# Patient Record
Sex: Female | Born: 1970 | Race: White | Hispanic: No | Smoking: Former smoker
Health system: Southern US, Community
[De-identification: ages and names within clinical notes are randomized; demographics above are authoritative.]

## PROBLEM LIST (undated history)

## (undated) DIAGNOSIS — K5792 Diverticulitis of intestine, part unspecified, without perforation or abscess without bleeding: Secondary | ICD-10-CM

## (undated) DIAGNOSIS — F419 Anxiety disorder, unspecified: Secondary | ICD-10-CM

## (undated) DIAGNOSIS — R000 Tachycardia, unspecified: Secondary | ICD-10-CM

## (undated) DIAGNOSIS — Z5189 Encounter for other specified aftercare: Secondary | ICD-10-CM

## (undated) DIAGNOSIS — F32A Depression, unspecified: Secondary | ICD-10-CM

## (undated) DIAGNOSIS — H269 Unspecified cataract: Secondary | ICD-10-CM

## (undated) DIAGNOSIS — K7460 Unspecified cirrhosis of liver: Secondary | ICD-10-CM

## (undated) DIAGNOSIS — J449 Chronic obstructive pulmonary disease, unspecified: Secondary | ICD-10-CM

## (undated) HISTORY — DX: Encounter for other specified aftercare: Z51.89

## (undated) HISTORY — PX: PATELLA FRACTURE SURGERY: SHX735

## (undated) HISTORY — PX: COLON SURGERY: SHX602

## (undated) HISTORY — PX: FRACTURE SURGERY: SHX138

## (undated) HISTORY — PX: OOPHORECTOMY: SHX86

## (undated) HISTORY — PX: INSERTION OF MESH: SHX5868

## (undated) HISTORY — DX: Unspecified cirrhosis of liver: K74.60

## (undated) HISTORY — DX: Unspecified cataract: H26.9

## (undated) HISTORY — DX: Depression, unspecified: F32.A

## (undated) HISTORY — PX: EYE SURGERY: SHX253

## (undated) HISTORY — PX: HERNIA REPAIR: SHX51

## (undated) HISTORY — PX: ABDOMINAL HERNIA REPAIR: SHX539

## (undated) HISTORY — DX: Anxiety disorder, unspecified: F41.9

---

## 1998-04-16 ENCOUNTER — Other Ambulatory Visit: Admission: RE | Admit: 1998-04-16 | Discharge: 1998-04-16 | Payer: Self-pay | Admitting: Obstetrics & Gynecology

## 2007-07-11 HISTORY — PX: ABDOMINAL HERNIA REPAIR: SHX539

## 2008-12-30 ENCOUNTER — Emergency Department (HOSPITAL_COMMUNITY): Admission: EM | Admit: 2008-12-30 | Discharge: 2008-12-31 | Payer: Self-pay | Admitting: Emergency Medicine

## 2009-07-10 HISTORY — PX: PATELLA FRACTURE SURGERY: SHX735

## 2009-07-12 ENCOUNTER — Emergency Department (HOSPITAL_COMMUNITY): Admission: EM | Admit: 2009-07-12 | Discharge: 2009-07-12 | Payer: Self-pay | Admitting: Family Medicine

## 2009-08-25 ENCOUNTER — Emergency Department (HOSPITAL_COMMUNITY): Admission: EM | Admit: 2009-08-25 | Discharge: 2009-08-26 | Payer: Self-pay | Admitting: Emergency Medicine

## 2010-02-22 ENCOUNTER — Inpatient Hospital Stay (HOSPITAL_COMMUNITY): Admission: EM | Admit: 2010-02-22 | Discharge: 2010-02-24 | Payer: Self-pay | Admitting: Emergency Medicine

## 2010-05-31 ENCOUNTER — Encounter: Admission: RE | Admit: 2010-05-31 | Discharge: 2010-05-31 | Payer: Self-pay | Admitting: Sports Medicine

## 2010-06-16 ENCOUNTER — Encounter
Admission: RE | Admit: 2010-06-16 | Discharge: 2010-07-05 | Payer: Self-pay | Source: Home / Self Care | Attending: Orthopedic Surgery | Admitting: Orthopedic Surgery

## 2010-07-10 HISTORY — PX: OOPHORECTOMY: SHX86

## 2010-07-10 HISTORY — PX: ANKLE FRACTURE SURGERY: SHX122

## 2010-09-23 LAB — BASIC METABOLIC PANEL
BUN: 4 mg/dL — ABNORMAL LOW (ref 6–23)
Chloride: 105 mEq/L (ref 96–112)
Chloride: 105 mEq/L (ref 96–112)
GFR calc Af Amer: >60 mL/min (ref >60)
Glucose, Bld: 118 mg/dL — ABNORMAL HIGH (ref 70–99)
Potassium: 4 mEq/L (ref 3.5–5.1)
Potassium: 4.5 mEq/L (ref 3.5–5.1)
Sodium: 137 mEq/L (ref 135–145)
Sodium: 138 mEq/L (ref 135–145)

## 2010-09-23 LAB — CBC
HCT: 34.4 % — ABNORMAL LOW (ref 36.0–46.0)
HCT: 35.7 % — ABNORMAL LOW (ref 36.0–46.0)
Hemoglobin: 11.5 g/dL — ABNORMAL LOW (ref 12.0–15.0)
MCH: 31.3 pg (ref 26.0–34.0)
MCHC: 33.4 g/dL (ref 30.0–36.0)
MCV: 93.7 fL (ref 78.0–100.0)
MCV: 93.9 fL (ref 78.0–100.0)
Platelets: 194 10*3/uL (ref 150–400)
Platelets: 204 10*3/uL (ref 150–400)
RBC: 3.67 MIL/uL — ABNORMAL LOW (ref 3.87–5.11)
RBC: 3.8 MIL/uL — ABNORMAL LOW (ref 3.87–5.11)
RDW: 13.7 % (ref 11.5–15.5)
WBC: 10.1 10*3/uL (ref 4.0–10.5)
WBC: 7.2 10*3/uL (ref 4.0–10.5)

## 2010-09-25 LAB — URINE CULTURE

## 2010-09-25 LAB — POCT PREGNANCY, URINE: Preg Test, Ur: NEGATIVE

## 2010-09-25 LAB — POCT URINALYSIS DIP (DEVICE)
Nitrite: NEGATIVE
Protein, ur: 30 mg/dL — AB
pH: 5.5 (ref 5.0–8.0)

## 2010-09-28 LAB — POCT PREGNANCY, URINE: Preg Test, Ur: NEGATIVE

## 2010-09-28 LAB — URINALYSIS, ROUTINE W REFLEX MICROSCOPIC
Glucose, UA: NEGATIVE mg/dL
Hgb urine dipstick: NEGATIVE
Specific Gravity, Urine: 1.007 (ref 1.005–1.030)
Urobilinogen, UA: 0.2 mg/dL (ref 0.0–1.0)

## 2010-09-28 LAB — DIFFERENTIAL
Lymphocytes Relative: 24 % (ref 12–46)
Lymphs Abs: 2.3 10*3/uL (ref 0.7–4.0)
Neutro Abs: 6.3 10*3/uL (ref 1.7–7.7)
Neutrophils Relative %: 67 % (ref 43–77)

## 2010-09-28 LAB — POCT I-STAT, CHEM 8
BUN: 11 mg/dL (ref 6–23)
Creatinine, Ser: 0.6 mg/dL (ref 0.4–1.2)
Hemoglobin: 13.9 g/dL (ref 12.0–15.0)
Potassium: 4 mEq/L (ref 3.5–5.1)
Sodium: 137 mEq/L (ref 135–145)

## 2010-09-28 LAB — CBC
Platelets: 248 10*3/uL (ref 150–400)
WBC: 9.5 10*3/uL (ref 4.0–10.5)

## 2010-09-28 LAB — WET PREP, GENITAL

## 2010-09-28 LAB — GC/CHLAMYDIA PROBE AMP, GENITAL: Chlamydia, DNA Probe: NEGATIVE

## 2010-10-14 ENCOUNTER — Emergency Department (HOSPITAL_COMMUNITY)
Admission: EM | Admit: 2010-10-14 | Discharge: 2010-10-14 | Disposition: A | Discharge status: Home / Self Care | Payer: Medicaid Other | Attending: Emergency Medicine | Admitting: Emergency Medicine

## 2010-10-14 ENCOUNTER — Emergency Department (HOSPITAL_COMMUNITY): Payer: Medicaid Other

## 2010-10-14 DIAGNOSIS — M5137 Other intervertebral disc degeneration, lumbosacral region: Secondary | ICD-10-CM | POA: Insufficient documentation

## 2010-10-14 DIAGNOSIS — F411 Generalized anxiety disorder: Secondary | ICD-10-CM | POA: Insufficient documentation

## 2010-10-14 DIAGNOSIS — S0990XA Unspecified injury of head, initial encounter: Secondary | ICD-10-CM | POA: Insufficient documentation

## 2010-10-14 DIAGNOSIS — M51379 Other intervertebral disc degeneration, lumbosacral region without mention of lumbar back pain or lower extremity pain: Secondary | ICD-10-CM | POA: Insufficient documentation

## 2010-10-14 DIAGNOSIS — S335XXA Sprain of ligaments of lumbar spine, initial encounter: Secondary | ICD-10-CM | POA: Insufficient documentation

## 2010-10-14 DIAGNOSIS — Y9229 Other specified public building as the place of occurrence of the external cause: Secondary | ICD-10-CM | POA: Insufficient documentation

## 2010-10-14 DIAGNOSIS — F988 Other specified behavioral and emotional disorders with onset usually occurring in childhood and adolescence: Secondary | ICD-10-CM | POA: Insufficient documentation

## 2010-10-14 LAB — POCT PREGNANCY, URINE: Preg Test, Ur: NEGATIVE

## 2011-10-05 IMAGING — CR DG ANKLE COMPLETE 3+V*R*
3 series · 3 of 3 positions shown · non-contrast
Comparison: None.

CLINICAL DATA: Status post fall down stairs, with diffuse right
ankle pain.

RIGHT ANKLE - COMPLETE 3+ VIEW

[view not recorded (1 of 3)]
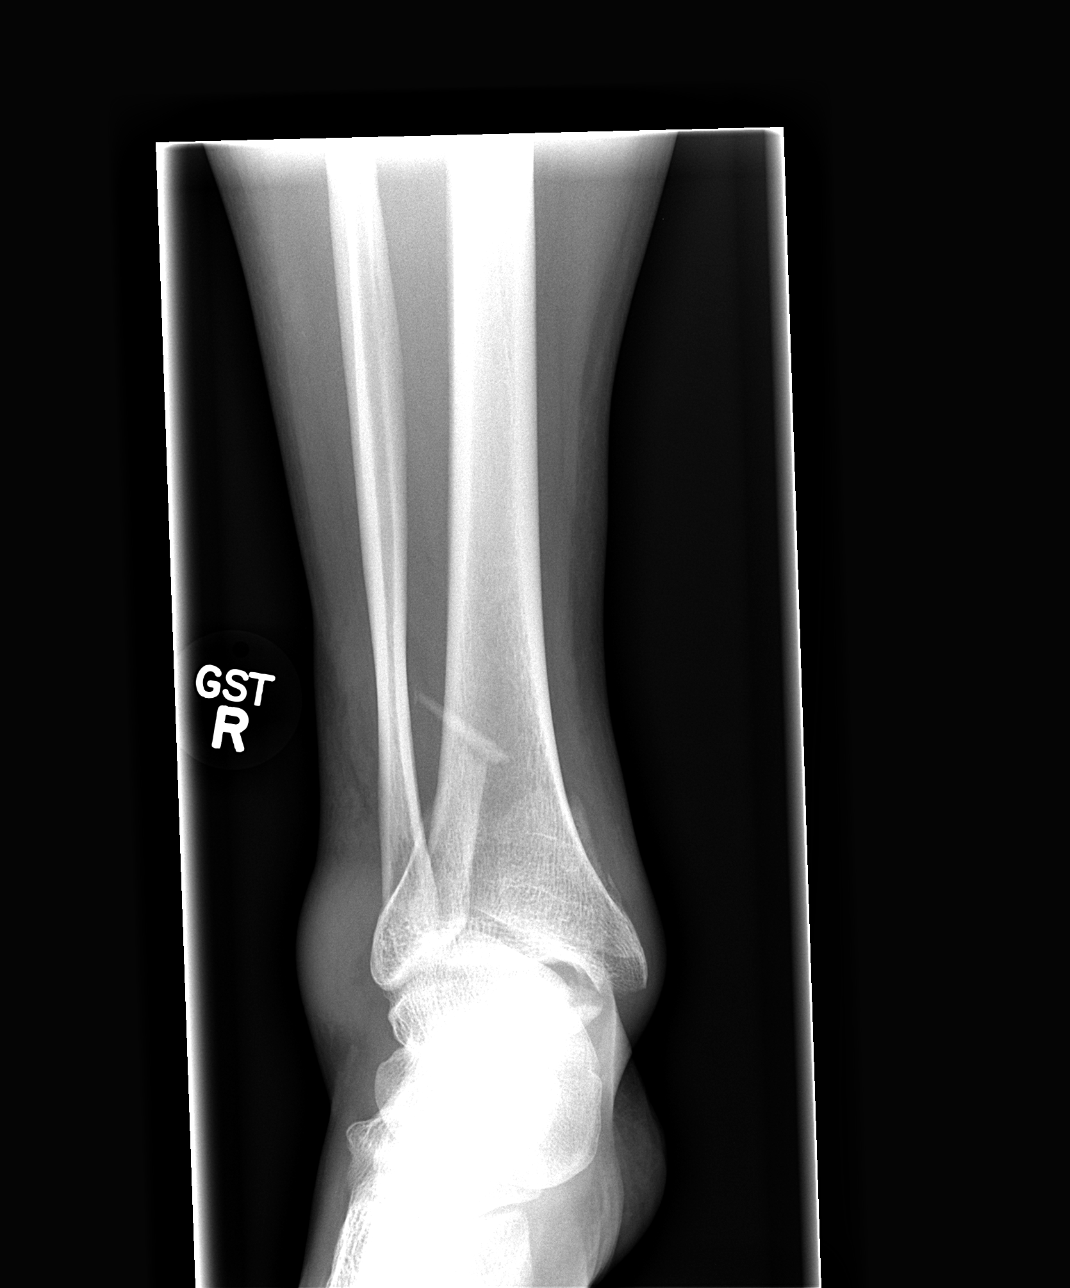

[view not recorded (2 of 3)]
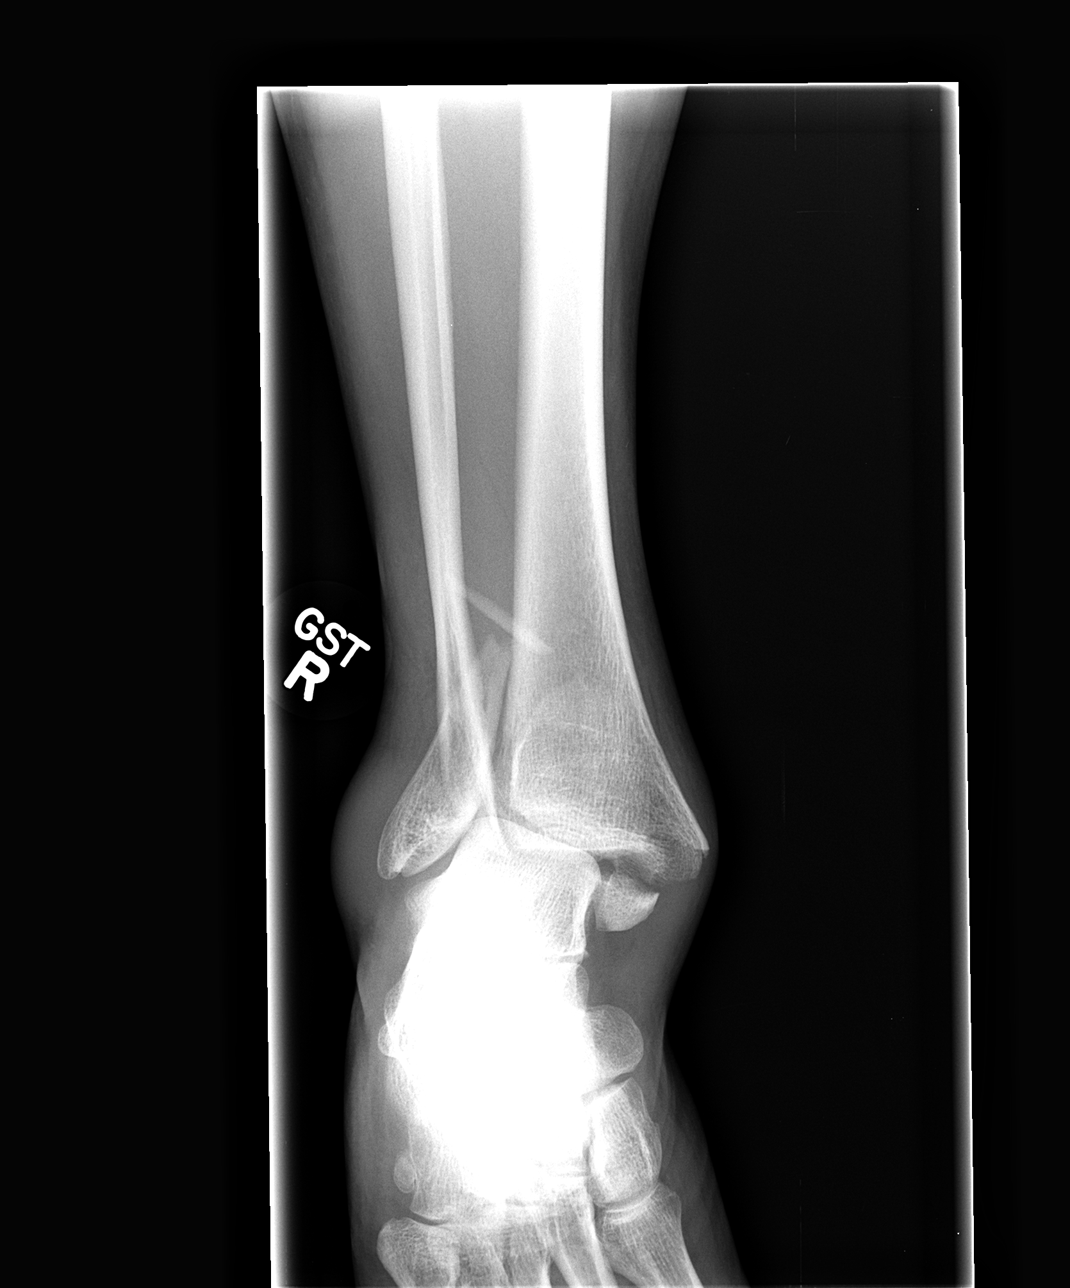

[view not recorded (3 of 3)]
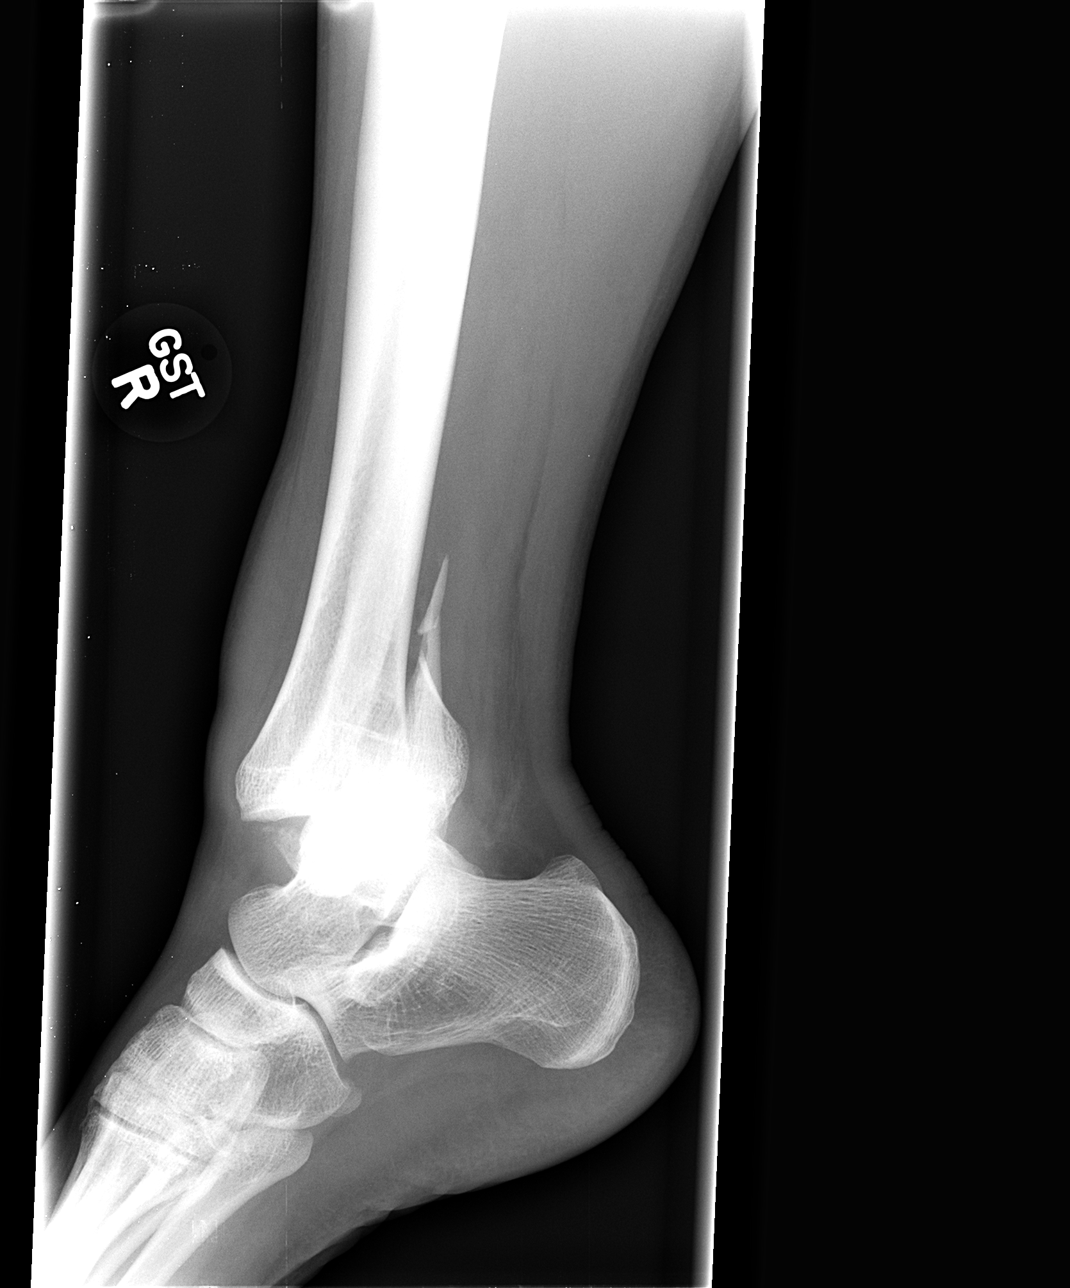

[3 of 3 positions shown; findings below may reference images not displayed]

FINDINGS: There is a significantly laterally displaced horizontal
fracture through the medial malleolus, a posteriorly displaced
fracture involving the posterior malleolus, and a comminuted
posteriorly displaced and laterally angulated fracture involving
the distal fibular diaphysis.  There is associated disruption of
the ankle mortise, with posterior talar subluxation and lateral
talar angulation.

An os peroneum is noted.  The remaining joint spaces are preserved.
Anterior and lateral soft tissue swelling is noted.
IMPRESSION: 1.  Significantly displaced horizontal fracture through the medial
malleolus, posterior displaced fracture involving the posterior
malleolus, and posteriorly displaced laterally angulated comminuted
fracture involving the distal fibular diaphysis.
2.  Associated posterior talar subluxation and lateral talar
angulation.
3.  Os peroneum noted.

## 2014-07-21 ENCOUNTER — Other Ambulatory Visit: Payer: Self-pay | Admitting: Gastroenterology

## 2014-07-21 DIAGNOSIS — R109 Unspecified abdominal pain: Secondary | ICD-10-CM

## 2014-07-30 ENCOUNTER — Ambulatory Visit
Admission: RE | Admit: 2014-07-30 | Discharge: 2014-07-30 | Disposition: A | Discharge status: Home / Self Care | Payer: Medicaid Other | Source: Ambulatory Visit | Attending: Gastroenterology | Admitting: Gastroenterology

## 2014-07-30 DIAGNOSIS — R109 Unspecified abdominal pain: Secondary | ICD-10-CM

## 2014-07-30 MED ORDER — IOHEXOL 300 MG/ML  SOLN
100.0000 mL | Freq: Once | INTRAMUSCULAR | Status: AC | PRN
  Administered 2014-07-30: 100 mL via INTRAVENOUS

## 2016-03-11 IMAGING — CT CT ABD-PELV W/ CM
3 of 5 series · 13 of 36 positions shown, 19 images · IV contrast (READICAT/WATER & [ID] OMNI 300)
Comparison: None.

CLINICAL DATA: Worsening lower abdominal pain for 2 years. Prior
cholecystectomy patient. Colostomy with reversal for ruptured
diverticulum. Incisional hernia repair.

EXAM:
CT ABDOMEN AND PELVIS WITH CONTRAST
TECHNIQUE: Multidetector CT imaging of the abdomen and pelvis was performed
using the standard protocol following bolus administration of
intravenous contrast.
CONTRAST:  100mL OMNIPAQUE IOHEXOL 300 MG/ML  SOLN

[Series 3: abd/pelvis with · axial · 0.82mm/px · z∈[-376,-46]mm · 7 of 88 slices shown, 12 images]
[im 11/88  soft-tissue]
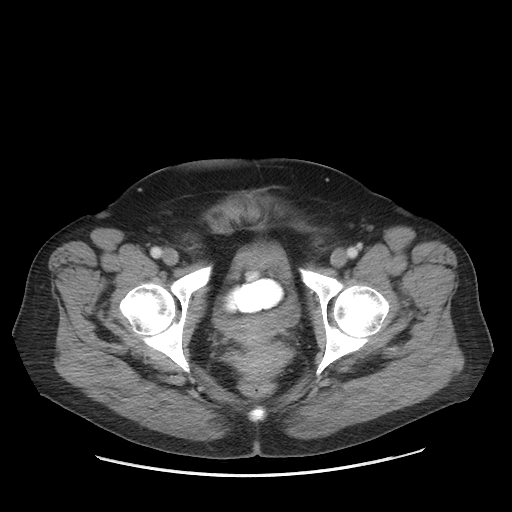
[im 11/88  bone]
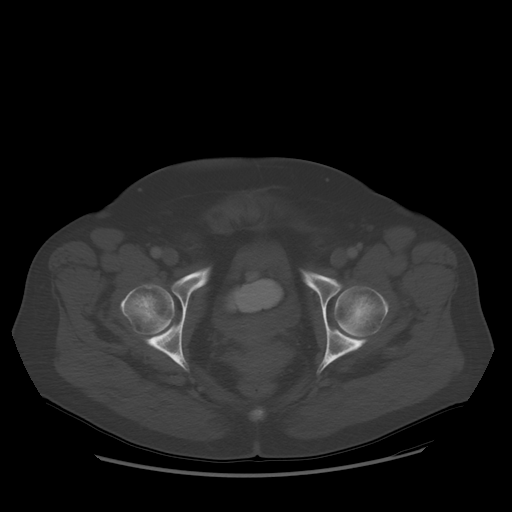
[im 22/88  soft-tissue]
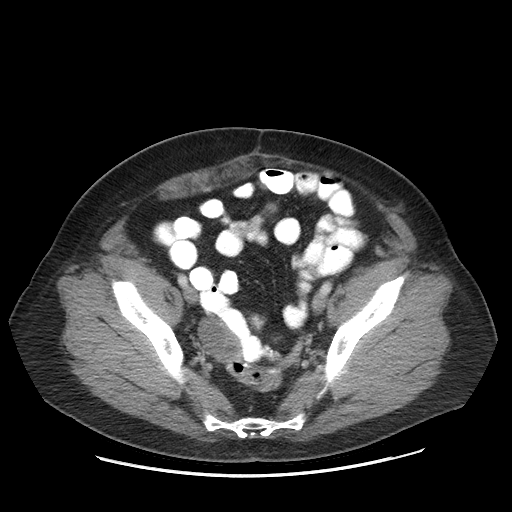
[im 33/88  soft-tissue]
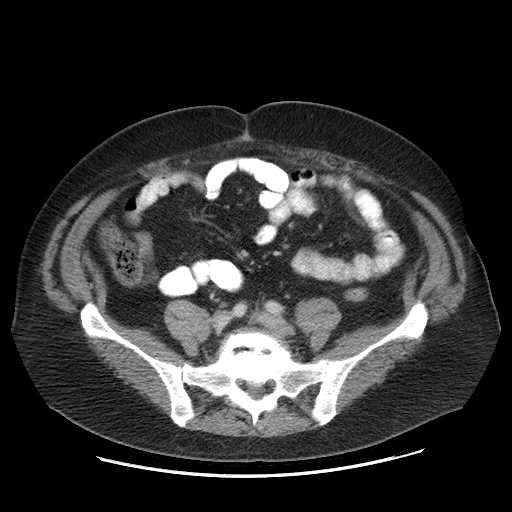
[im 44/88  soft-tissue]
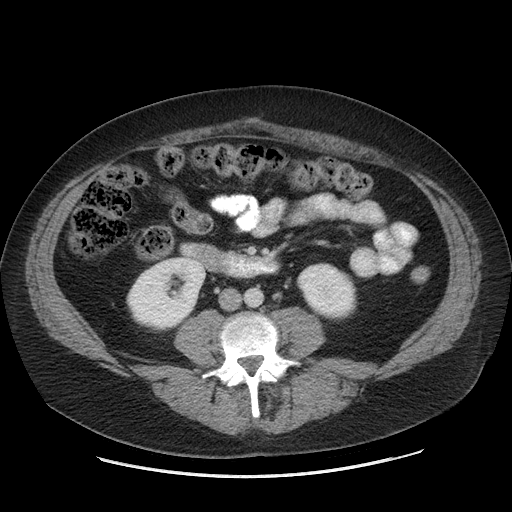
[im 44/88  lung]
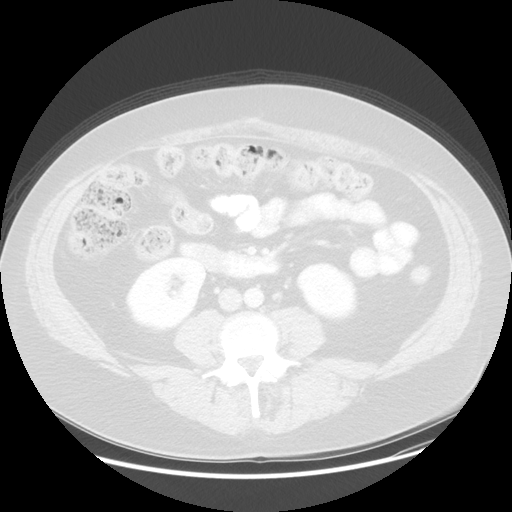
[im 55/88  soft-tissue]
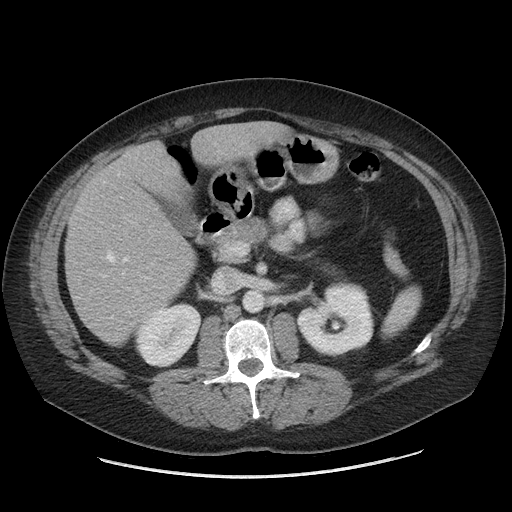
[im 55/88  lung]
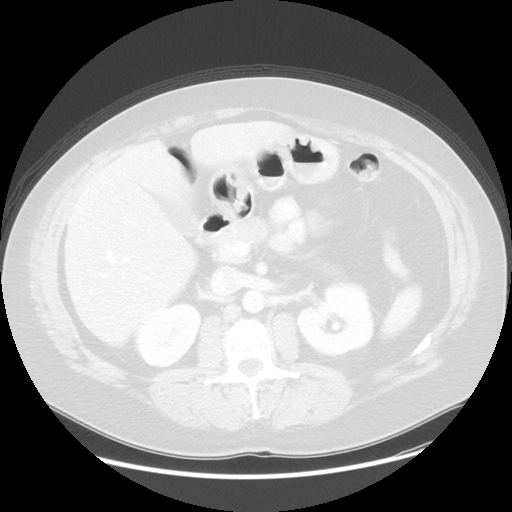
[im 66/88  soft-tissue]
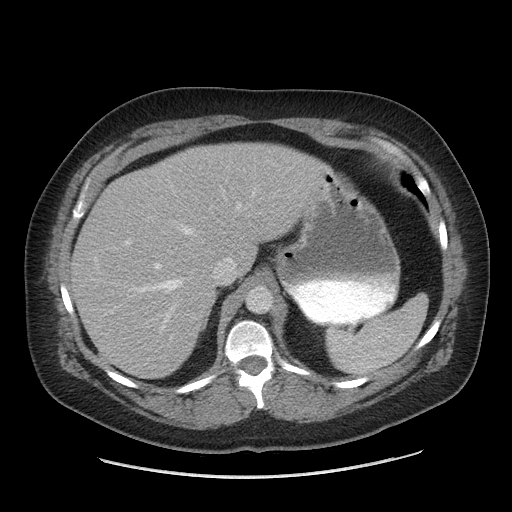
[im 66/88  lung]
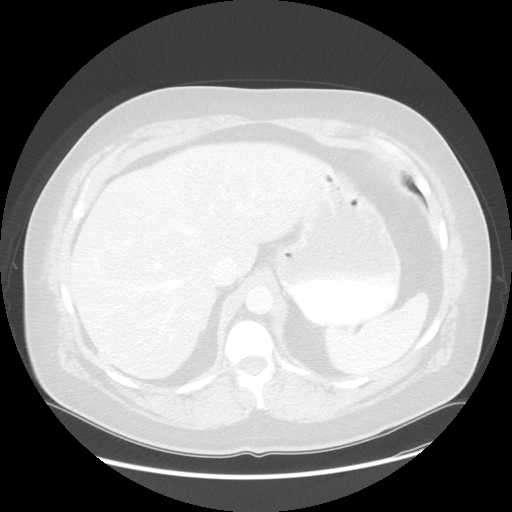
[im 77/88  soft-tissue]
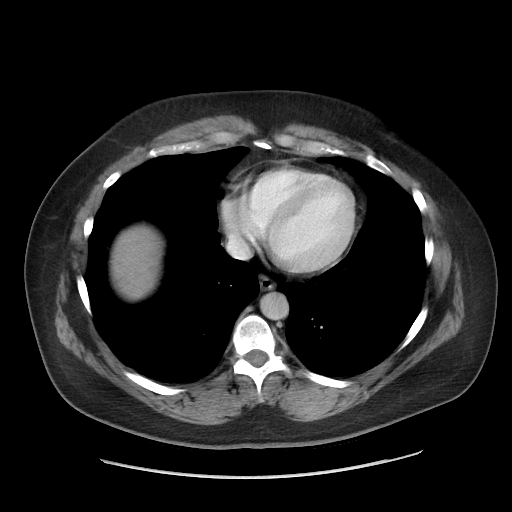
[im 77/88  lung]
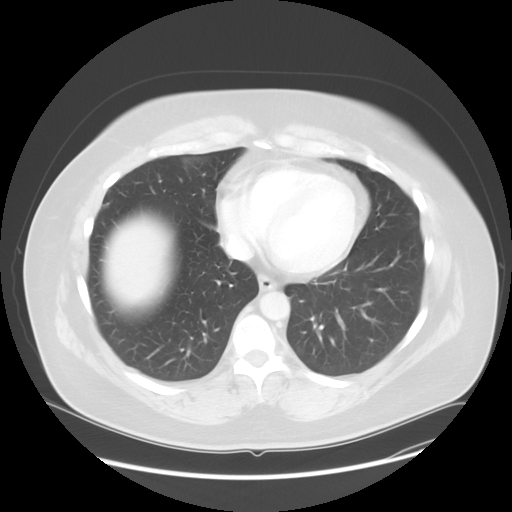

[Series 601: coronal body · coronal · 0.87mm/px · 1 of 129 slices shown, 2 images]
[im 43/129  soft-tissue]
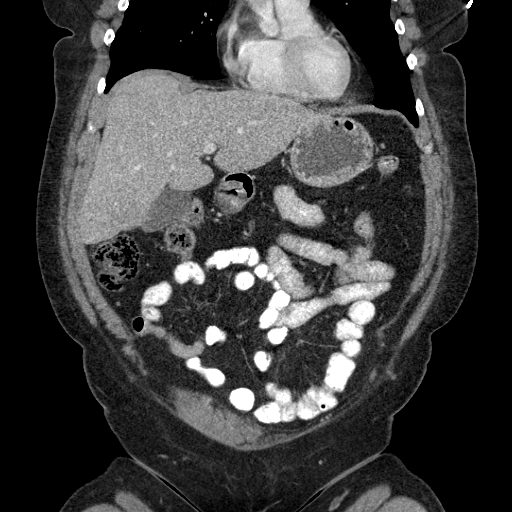
[im 43/129  bone]
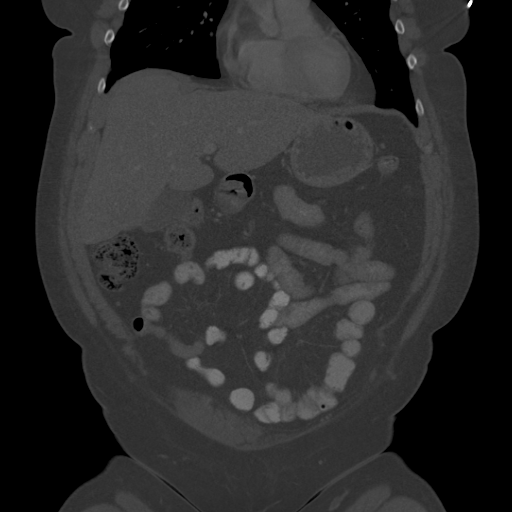

[Series 602: sagittal body · sagittal · 0.87mm/px · 5 of 169 slices shown]
[im 20/169  soft-tissue]
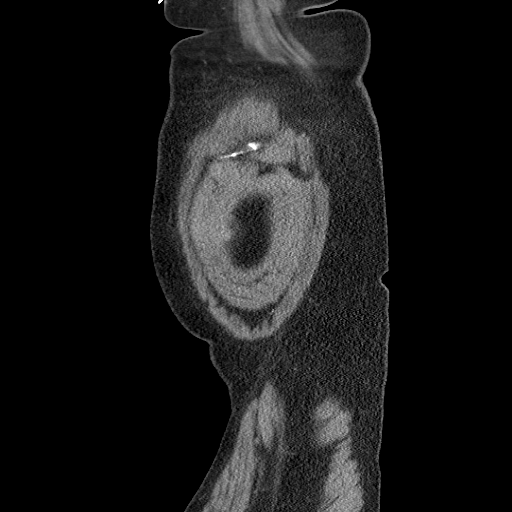
[im 40/169  soft-tissue]
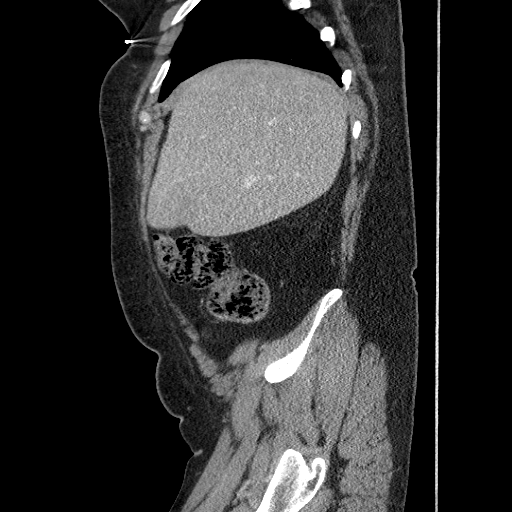
[im 60/169  soft-tissue]
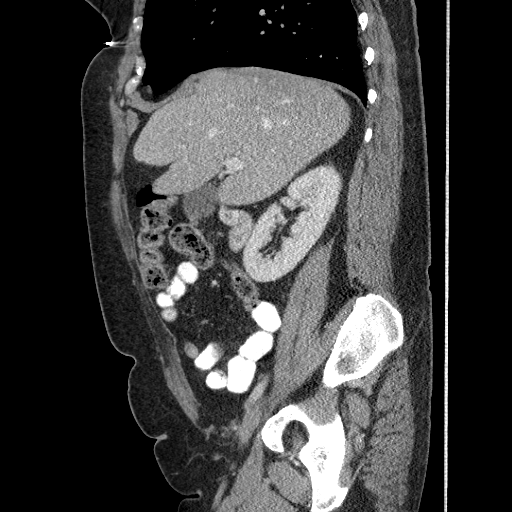
[im 80/169  soft-tissue]
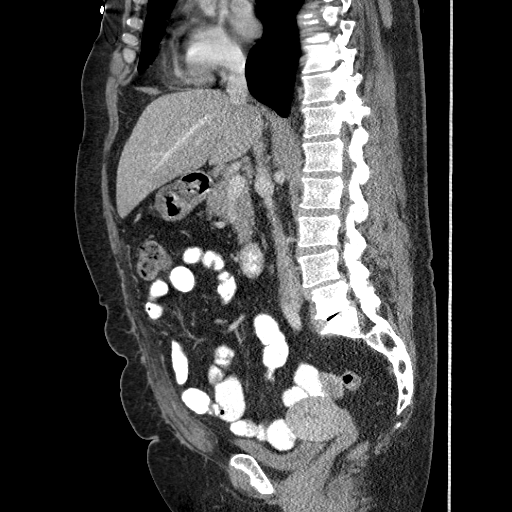
[im 99/169  soft-tissue]
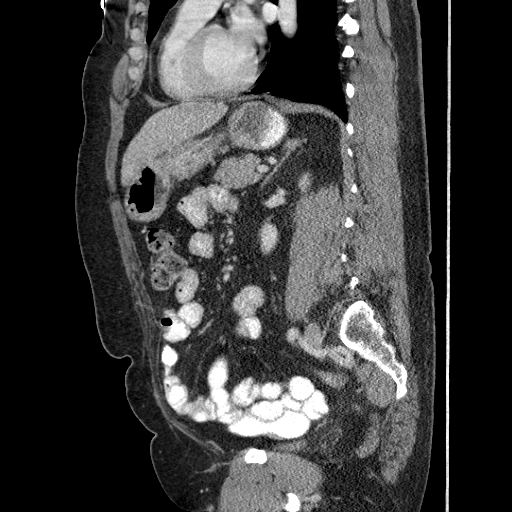

[13 of 36 positions shown; findings below may reference images not displayed]

FINDINGS: Lower chest:  Lung bases are clear.

Hepatobiliary: Low-density cyst in the right hepatic lobe measures
less 10 mm. No biliary duct dilatation. Normal gallbladder.

Pancreas: Pancreas is normal. No ductal dilatation. No pancreatic
inflammation.

Spleen: Normal spleen.

Adrenals/urinary tract: Adrenal glands are normal kidneys, ureters,
and bladder are normal.

Stomach/Bowel: Stomach, small bowel, appendix, cecum are normal. The
left colon is collapsed. No obstructing lesion is identified. There
is a bowel anastomosis in the sigmoid colon without obstruction or
nodularity. Rectum appears normal.

Vascular/Lymphatic: Abdominal aorta is normal caliber. There is no
retroperitoneal or periportal lymphadenopathy. No pelvic
lymphadenopathy.

Reproductive: Right ovaryhas low-density cystic change measuring
by 2.9 cm. Left ovary is absent. No free fluid the pelvis. No pelvic
lymphadenopathy

Musculoskeletal: No aggressive osseous lesion.

Other: Ventral hernia repair without complicating features appear.
IMPRESSION: 1. No acute findings in the abdomen pelvis. No bowel obstruction or
inflammation.
2. Ventral hernia repair without complication.
3. Mild cysts enlargement of right ovary is felt to within normal
limits. This recommendation follows ACR consensus guidelines: White
Paper of the ACR Incidental Findings Committee II on Adnexal
Findings. [HOSPITAL] [DATE].

## 2022-04-12 ENCOUNTER — Ambulatory Visit (HOSPITAL_COMMUNITY)
Admission: EM | Admit: 2022-04-12 | Discharge: 2022-04-12 | Disposition: A | Discharge status: Home / Self Care | Payer: No Payment, Other | Attending: Psychiatry | Admitting: Psychiatry

## 2022-04-12 DIAGNOSIS — F419 Anxiety disorder, unspecified: Secondary | ICD-10-CM | POA: Insufficient documentation

## 2022-04-12 DIAGNOSIS — F1010 Alcohol abuse, uncomplicated: Secondary | ICD-10-CM | POA: Insufficient documentation

## 2022-04-12 DIAGNOSIS — F339 Major depressive disorder, recurrent, unspecified: Secondary | ICD-10-CM | POA: Insufficient documentation

## 2022-04-12 NOTE — Progress Notes (Signed)
Pt says that she came to [place] she is "so anxious I don't what to do."  She has depression.  Pt says that she feels like she is not able to deal with the anxiety and depression.  She lives with her 51 year old mother and cares for her.  She is not working now due to her anxiety. Pt does not have any SI or no attempts.  No HI or A/V hallucinations.  Pt admits to drinking ETOH "as much as I can and being able to get away with it."  Pt may drink < a bottle of wine or a 12 pack of hard seltzers a day.  Pt drank about a 6 pack of hard seltzers in the last 24 hours.  Patient uses CBD (edibles and vape cartridges) daily.  Pt has had medical issues in the past   She is not on any medication currently.  Pt has no current outpatient therapy. Pt is routine.

## 2022-04-12 NOTE — Discharge Instructions (Addendum)
F/u with walk-in psychiatry  

## 2022-04-12 NOTE — ED Notes (Signed)
AVS reviewed and 2nd floor out pt services information provided.

## 2022-04-12 NOTE — ED Provider Notes (Signed)
Behavioral Health Urgent Care Medical Screening Exam  Patient Name: Sydney Mccoy MRN: [id number] Date of Evaluation: 04/12/22 Chief Complaint:   Diagnosis:  Final diagnoses:  Recurrent major depressive disorder, remission status unspecified ([place])  Alcohol abuse  Anxious mood    History of Present illness: Sydney Mccoy is a 51 y.o. female. With a history of major depressive disorder and general anxiety disorder presented to [facility] voluntarily.  According to patient she is having extreme anxiety all the time and this has been going on since she was age 23 years old.  According to patient she was diagnosed with depression and anxiety a couple years ago before her now 67 year old son was 88 years old and she was prescribed medication and antidepressants however she felt better if she stopped taking them because her insurance ran out around 7 years ago.  Patient stated that [place] works good for her. At according to patient she is not currently seeing a psychiatrist or therapist.  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  Patient is unemployed outside of the home.  According to patient she drinks alcohol daily, and she is very daily.  Face-to-face observation of patient, patient is alert and oriented x 4, speech is clear, maintaining good eye contact.  Mood is depressed and anxious affect congruent with mood.  Patient denies suicidal ideation, HI, AVH or paranoia.  Per the patient she can keep herself safe and she does not feel that she is not a danger to herself.  According to patient she just need to get some help for her depression and anxiety.  According to patient she smokes CBD daily.  Patient report she drinks alcohol on a daily basis.  She denies any other illicit drug use.  NP discussed with patient the walk-in clinic that is available and the patient was given information to follow up with walk-in clinic in the a.m.  Psychiatric Specialty  Exam  Presentation  General Appearance:Casual  Eye Contact:Fair  Speech:Clear and Coherent  Speech Volume:Normal  Handedness:Right   Mood and Affect  Mood: Anxious; Depressed  Affect: Congruent   Thought Process  Thought Processes: Coherent  Descriptions of Associations:Circumstantial  Orientation:Full (Time, Place and Person)  Thought Content:Logical    Hallucinations:None  Ideas of Reference:None  Suicidal Thoughts:No  Homicidal Thoughts:No   Sensorium  Memory:No data recorded Judgment: Fair  Insight: Good   Executive Functions  Concentration: Good  Attention Span: Good  Recall: Good  Fund of Knowledge: Good  Language: Good   Psychomotor Activity  Psychomotor Activity: Normal   Assets  Assets: Desire for Improvement   Sleep  Sleep: Fair  Number of hours:  5   No data recorded  Physical Exam: Physical Exam HENT:     Head: Normocephalic.     Nose: Nose normal.  Cardiovascular:     Rate and Rhythm: Normal rate.  Pulmonary:     Effort: Pulmonary effort is normal.  Musculoskeletal:        General: Normal range of motion.     Cervical back: Normal range of motion.  Skin:    General: Skin is warm.  Neurological:     General: No focal deficit present.     Mental Status: She is alert.  Psychiatric:        Behavior: Behavior normal.        Thought Content: Thought content normal.    Review of Systems  Constitutional: Negative.   HENT: Negative.  Eyes: Negative.   Respiratory: Negative.    Cardiovascular: Negative.   Gastrointestinal: Negative.   Genitourinary: Negative.   Musculoskeletal: Negative.   Skin: Negative.   Neurological: Negative.   Endo/Heme/Allergies: Negative.   Psychiatric/Behavioral:  Positive for depression and substance abuse. The patient is nervous/anxious.    Blood pressure (!) 161/101, pulse 93, temperature 98.6 F (37 C), temperature source Oral, resp. rate 18, SpO2 95 %. There  is no height or weight on file to calculate BMI.  Musculoskeletal: Strength & Muscle Tone: within normal limits Gait & Station: normal Patient leans: N/A   [place] [facility] Discharge Disposition for Follow up and Recommendations: Based on my evaluation the patient does not appear to have an emergency medical condition and can be discharged with resources and follow up care in outpatient services for Medication Management and Individual Therapy   Evette Georges, NP 04/12/2022, 8:28 PM

## 2022-04-12 NOTE — Progress Notes (Signed)
[facility] entered resources into AVS  Jeannette Maddy [facility] 

## 2022-04-13 ENCOUNTER — Encounter (HOSPITAL_COMMUNITY): Payer: Self-pay

## 2022-04-13 ENCOUNTER — Other Ambulatory Visit: Payer: Self-pay

## 2022-04-13 ENCOUNTER — Ambulatory Visit (INDEPENDENT_AMBULATORY_CARE_PROVIDER_SITE_OTHER): Payer: No Payment, Other | Admitting: Student in an Organized Health Care Education/Training Program

## 2022-04-13 ENCOUNTER — Encounter (HOSPITAL_COMMUNITY): Payer: Self-pay | Admitting: Student in an Organized Health Care Education/Training Program

## 2022-04-13 ENCOUNTER — Ambulatory Visit (HOSPITAL_COMMUNITY)
Admission: EM | Admit: 2022-04-13 | Discharge: 2022-04-13 | Disposition: A | Discharge status: Home / Self Care | Payer: No Payment, Other

## 2022-04-13 VITALS — BP 166/110 | HR 100 | Ht 67.0 in | Wt 174.0 lb

## 2022-04-13 DIAGNOSIS — F332 Major depressive disorder, recurrent severe without psychotic features: Secondary | ICD-10-CM

## 2022-04-13 DIAGNOSIS — F410 Panic disorder [episodic paroxysmal anxiety] without agoraphobia: Secondary | ICD-10-CM

## 2022-04-13 DIAGNOSIS — F411 Generalized anxiety disorder: Secondary | ICD-10-CM | POA: Diagnosis not present

## 2022-04-13 MED ORDER — HYDROXYZINE HCL 25 MG PO TABS
25.0000 mg | ORAL_TABLET | Freq: Three times a day (TID) | ORAL | 1 refills | Status: DC | PRN
  Filled 2022-04-13: qty 60, 20d supply, fill #0
  Filled 2022-05-30: qty 60, 20d supply, fill #1

## 2022-04-13 MED ORDER — PROPRANOLOL HCL 10 MG PO TABS
10.0000 mg | ORAL_TABLET | Freq: Three times a day (TID) | ORAL | 1 refills | Status: DC
  Filled 2022-04-13: qty 90, 30d supply, fill #0

## 2022-04-13 MED ORDER — MIRTAZAPINE 15 MG PO TABS
15.0000 mg | ORAL_TABLET | Freq: Every day | ORAL | 1 refills | Status: DC
  Filled 2022-04-13: qty 30, 30d supply, fill #0

## 2022-04-13 NOTE — BH Assessment (Signed)
Pt reporting worsening anxiety and depression. While attempting to get outpatient services at [facility] pt reported haivng an anxiety attack. Pt was brought down stairs by [facility] staff for crisis assessment. Pt denies SI, HI, AVH and reports having 4 alcoholic drinks around 2am today. Pt is requesting medication management and was escorted back up stairs by NP to see a provider.

## 2022-04-13 NOTE — Plan of Care (Signed)
  Problem: Anxiety Disorder CCP Problem  1  Goal: LTG: Patient will score less than 5 on the Generalized Anxiety Disorder 7 Scale (GAD-7) Outcome: Initial Goal: STG: Patient will participate in at least 80% of scheduled individual psychotherapy sessions Outcome: Initial Goal: STG: Report a decrease in anxiety symptoms as evidenced by an overall reduction in anxiety score by a minimum of 25% on the Generalized Anxiety Disorder Scale Outcome: Initial

## 2022-04-13 NOTE — ED Notes (Signed)
Patient discharging at this time. Patient denies SI,HI and A/V/H with no plan/intent. Discharge instructions and follow up appointments reviewed by NP and patient was escorted back upstairs for outpatient services to discuss medication management. No s/s of current distress.

## 2022-04-13 NOTE — Progress Notes (Signed)
Psychiatric Initial Adult Assessment   Patient Identification: Sydney Mccoy MRN:  [id number] Date of Evaluation:  04/15/2022 Referral Source: [facility] Follow Up Chief Complaint:   Chief Complaint  Patient presents with   Establish Care   Anxiety   Depression   Visit Diagnosis:    ICD-10-CM   1. GAD (generalized anxiety disorder)  F41.1 mirtazapine (REMERON) 15 MG tablet    propranolol (INDERAL) 10 MG tablet    hydrOXYzine (ATARAX) 25 MG tablet    2. Panic disorder  F41.0 mirtazapine (REMERON) 15 MG tablet    propranolol (INDERAL) 10 MG tablet    hydrOXYzine (ATARAX) 25 MG tablet    3. Severe episode of recurrent major depressive disorder, without psychotic features (HCC)  F33.2 mirtazapine (REMERON) 15 MG tablet      History of Present Illness:   Sydney Mccoy is a 51 yr old female who presents from [facility] to establish care and for medication management and therapy.  PPHx is significant for Depression, Anxiety, Bulimia, ADHD, and Auditory Processing Disorder, Suicidal Gestures (teenage years), 1 Hospitalization (age 24- [facility]), and no History of Self Injurious Behavior.  She reports that she has been having issues with anxiety her whole life but that it is never been just debilitating and never felt like she has completely lost control before.  She reports she just woke up with this.  She reports that another trigger could be her approaching age 68 as that when her father died of a heart attack.  She reports that she has been using alcohol and Benadryl to sleep and she knows this cannot continue.  She reports that she cannot sleep without the Benadryl.  She reports her appetite is very poor.  She reports that she was relatively well-controlled while on medicine and regularly seeing a therapist but that this stopped about 7 years ago when she lost her insurance.  He reports past psychiatric history significant for depression, anxiety, bulimia, ADHD, and auditory processing disorder.   She reports the bulimia was when she was a teenager and has not had any issues with that since then.  She reports there were suicidal gestures when she was a teenager but no attempts.  She reports no history of self-injurious behavior.  She does report 1 prior hospitalization at [facility] when she was 51 years old.  She reports her previous medications including-Lexapro, Prozac, Wellbutrin, Zoloft, Cymbalta, Ritalin, and Klonopin.  He reports past surgical history significant for 4 abdominal surgeries post colon perforation, left knee surgery, and 3 surgeries on her right ankle.  She reports past medical history significant for hyperlipidemia and hypertension.  She reports no history of head trauma or seizures.  She reports she last worked 10 years ago at [employer].  She currently lives at her mother's house with her mother.  She reports graduating high school and had some college.  She reports drinking 4 alcoholic seltzer's a day.  She reports she quit smoking cigarettes 2 years ago.  She reports daily CBD Vape use.  She reports no other illicit substance use.  She reports no current legal issues.  She reports no access to firearms.  Discussed with her given her depression, anxiety, sleep issues, and appetite issues we could trial Remeron.  Discussed with her that since this can take several weeks to become fully effective we could start other medications that would help in the short-term.  Discussed that given her hypertension we could start propranolol as this would help with anxiety as well.  Also discussed starting as needed hydroxyzine for acute panic spikes.  Discussed potential risks and side effects of the medications and she was agreeable to trialing them.  Discussed that we could start weekly therapy as well and she was agreeable to this.  She reports that she does not have a PCP so provided her with a list of providers who see the uninsured.  She reports no SI, HI, or AVH.  She reports her appetite is  doing poor.  She reports her sleep is poor.  She reports some mild dizziness/weakness but otherwise reports no other concerns at present.  Discussed with her what to do in the event of a future crisis.  Discussed that she can return to [facility], go to [facility], go to the nearest ED, or call [phone].   She reported understanding and had no concerns.  She will return for weekly therapy starting next week and for medication management in 4 weeks.   Associated Signs/Symptoms: Depression Symptoms:  depressed mood, anhedonia, fatigue, feelings of worthlessness/guilt, hopelessness, anxiety, panic attacks, loss of energy/fatigue, disturbed sleep, decreased appetite, (Hypo) Manic Symptoms:   Reports None Anxiety Symptoms:  Excessive Worry, Panic Symptoms, Psychotic Symptoms:   Reports None PTSD Symptoms: NA  Past Psychiatric History: Depression, Anxiety, Bulimia, ADHD, and Auditory Processing Disorder, Suicidal Gestures (teenage years), 1 Hospitalization (age 61- 63), and no History of Self Injurious Behavior.  Previous Psychotropic Medications: Yes  Lexapro, Prozac, Wellbutrin, Zoloft, Cymbalta, Ritalin, and Klonopin.  Substance Abuse History in the last 12 months:  Yes.    Consequences of Substance Abuse: NA  Past Medical History: No past medical history on file.   Family Psychiatric History: Brother- Depression, Anxiety, EtOH Abuse Mother and Maternal Grandmother- Some unknown Diagnosis' No Known Suicide.  Family History: No family history on file.  Social History:   Social History   Socioeconomic History   Marital status: Divorced    Spouse name: Not on file   Number of children: Not on file   Years of education: Not on file   Highest education level: Not on file  Occupational History   Not on file  Tobacco Use   Smoking status: Former    Types: Cigarettes   Smokeless tobacco: Never  Substance and Sexual Activity   Alcohol use: Yes    Alcohol/week: 28.0 standard  drinks of alcohol    Types: 28 Standard drinks or equivalent per week   Drug use: Yes    Comment: CBD oil   Sexual activity: Not on file  Other Topics Concern   Not on file  Social History Narrative   Not on file   Social Determinants of Health   Financial Resource Strain: Not on file  Food Insecurity: Not on file  Transportation Needs: Not on file  Physical Activity: Not on file  Stress: Not on file  Social Connections: Not on file    Additional Social History: None  Allergies:  Not on File  Metabolic Disorder Labs: No results found for: "HGBA1C", "MPG" No results found for: "PROLACTIN" No results found for: "CHOL", "TRIG", "HDL", "CHOLHDL", "VLDL", "LDLCALC" No results found for: "TSH"  Therapeutic Level Labs: No results found for: "LITHIUM" No results found for: "CBMZ" No results found for: "VALPROATE"  Current Medications: Current Outpatient Medications  Medication Sig Dispense Refill   hydrOXYzine (ATARAX) 25 MG tablet Take 1 tablet (25 mg total) by mouth 3 (three) times daily as needed. 60 tablet 1   mirtazapine (REMERON) 15 MG tablet Take  a half tablet (7.5mg ) by mouth dailiy for 7 days, then take a whole tablet at night. 30 tablet 1   propranolol (INDERAL) 10 MG tablet Take 1 tablet (10 mg total) by mouth 3 (three) times daily. 90 tablet 1   No current facility-administered medications for this visit.    Musculoskeletal: Strength & Muscle Tone: within normal limits Gait & Station: normal Patient leans: N/A  Psychiatric Specialty Exam: Review of Systems  Respiratory:  Negative for shortness of breath.   Cardiovascular:  Negative for chest pain.  Gastrointestinal:  Negative for abdominal pain, constipation, diarrhea, nausea and vomiting.  Neurological:  Positive for dizziness and weakness. Negative for headaches.  Psychiatric/Behavioral:  Positive for dysphoric mood and sleep disturbance. Negative for agitation, behavioral problems, hallucinations and  self-injury. The patient is nervous/anxious.     Blood pressure (!) 166/110, pulse 100, height 5\' 7"  (1.702 m), weight 174 lb (78.9 kg), SpO2 96 %.Body mass index is 27.25 kg/m.  General Appearance: Casual and Fairly Groomed  Eye Contact:  Fair  Speech:  Clear and Coherent and Normal Rate  Volume:  Normal  Mood:  Anxious  Affect:  Congruent  Thought Process:  Coherent and Goal Directed  Orientation:  Full (Time, Place, and Person)  Thought Content:  WDL and Logical  Suicidal Thoughts:  No  Homicidal Thoughts:  No  Memory:  Immediate;   Good Recent;   Good  Judgement:  Fair  Insight:  Fair  Psychomotor Activity:  Normal  Concentration:  Concentration: Fair and Attention Span: Fair  Recall:  Good  Fund of Knowledge:Good  Language: Good  Akathisia:  Negative  Handed:  Right  AIMS (if indicated):  not done  Assets:  Communication Skills Desire for Improvement Housing Resilience Social Support  ADL's:  Intact  Cognition: WNL  Sleep:  Poor   Screenings: GAD-7    Flowsheet Row Office Visit from 04/13/2022 in [facility]  Total GAD-7 Score 19      PHQ2-9    Flowsheet Row Office Visit from 04/13/2022 in [facility]  PHQ-2 Total Score 6  PHQ-9 Total Score 24       Assessment and Plan:  AASHA DINA is a 51 yr old female who presents from [facility] to establish care and for medication management and therapy.  PPHx is significant for Depression, Anxiety, Bulimia, ADHD, and Auditory Processing Disorder, Suicidal Gestures (teenage years), 1 Hospitalization (age 39- [facility]), and no History of Self Injurious Behavior.   Sydney Mccoy meets criteria for MDD and GAD based on her symptoms and scores on the PHQ-9: 24 and GAD-7:21.  For acute panic we will start Hydroxyzine.  For Anxiety and given her HTN we will start Propanolol.  For her depression and anxiety long term treatment and sleep issues we will start Remeron.  She will  return to start therapy next week.  She will return for follow-up for medication management in 4 weeks.  She was provided with a list of PCP providers at checkout.   GAD  MDD: -Start Remeron 7.5 mg QHS for 7 days then increase to 15 mg QHS for depression, andxiety, sleep, and appetite.  30 (15 mg) tablets with 1 refill. -Start Propanolol 10 mg TID for anxiety and HTN.  90 tablets with 1 refill. -Start Hydroxyzine 25 mg TID PRN for anxiety.  60 tablets with 1 refill.   Collaboration of Care:   Patient/Guardian was advised Release of Information must be obtained prior to  any record release in order to collaborate their care with an outside provider. Patient/Guardian was advised if they have not already done so to contact the registration department to sign all necessary forms in order for [place] to release information regarding their care.   Consent: Patient/Guardian gives verbal consent for treatment and assignment of benefits for services provided during this visit. Patient/Guardian expressed understanding and agreed to proceed.   Lauro Franklin, MD 10/7/20235:13 AM

## 2022-04-21 ENCOUNTER — Ambulatory Visit (INDEPENDENT_AMBULATORY_CARE_PROVIDER_SITE_OTHER): Payer: No Payment, Other | Admitting: Student in an Organized Health Care Education/Training Program

## 2022-04-21 DIAGNOSIS — F410 Panic disorder [episodic paroxysmal anxiety] without agoraphobia: Secondary | ICD-10-CM | POA: Diagnosis not present

## 2022-04-21 DIAGNOSIS — F332 Major depressive disorder, recurrent severe without psychotic features: Secondary | ICD-10-CM

## 2022-04-21 DIAGNOSIS — F411 Generalized anxiety disorder: Secondary | ICD-10-CM | POA: Diagnosis not present

## 2022-04-21 NOTE — Progress Notes (Cosign Needed Addendum)
[facility] [place] [address] Dept: [phone] Dept Fax: [phone]  Psychotherapy Progress Note  Patient ID: Sydney Mccoy, female  DOB: 11/20/1970, 51 y.o.  MRN: [id number]  04/21/2022 Start time: 10:02 AM End time: 10:50 AM  Method of Visit: Face-to-Face  Present: patient  Current Concerns:  She reports that she has already noticed a difference since starting her medications last week.  She reports she has not been "feeling like living in impending doom."  When asked what her goals for therapy going forward would be she reports that she needs to unbottle things.  She reports that she feels like she does not have anyone she can open up to.  She reports that her anxiety has kept her in the house so she only has her mother and boyfriend to really talk to.  She reports that she has had issues with anxiety and depression since she was a child.  She reports that she remembers when it distinctly got worse was when she began to develop cataracts.  She reports that her mother went through cataract surgery but they were significant issues that required multiple surgeries and this led her to not go through that herself.  She reports that she was legally blind for about 3 years because she let it get that bad.  She reports that she used this as an excuse not to leave the house or do things but that when she finally did have that corrected she began worrying and this is when her panic attacks returned.  She reports what scares her the most over her recent round of panic attacks/crisis was that she was not sure where it came from.  With further discussion/reflection she thinks it could part of it could have been due to her boyfriend coming up from [place] to visit and this being the first time the family met him even though they have been dating for 9 years.  She reports she knows where her issues stem from and states that  it has been problems with self-esteem.  She reports these started when she was 12.  She reports she suddenly found herself much taller than the other girls in her class and even some of the teachers.  She reports she even used her father's ties to bind her chest so that the other girls would not see her wearing a bra.  She then reports things did get a little better once she was diagnosed with ADHD because after starting medication she was able to succeed in high school.  She reports things then got worse when she went to college because she became homesick/anxious and dropped out.  She reports that throughout her life whenever she does see a therapist she improves but then once the therapy ends she begins to bottle things up again to the point where her symptoms reemerge and interfere with her life.  She then recounted how she has no willpower.  Discussed her smoking.  She reported she tried multiple times and then stopped 2 years ago except for a few puffs on a single cigarette during this latest anxiety attack.  Discussed with her how that shows significant willpower and strength to do something like that.  She reports that she did go shopping with her mother yesterday which was something she has not done in a while.  She states they got some flowers and did get them out of their containers into pots but had not yet  planted them.  Discussed this being a goal for next week which she was agreeable with.  She also reports she took a walk around the neighborhood yesterday which is something she has not done in a while.  Encouraged her to continue doing this as she reports fall is her favorite season and the leaves are currently changing which she reports she enjoys watching.  Prior to leaving the appointment she confirmed she was in a stable and safe mindset.  She report no SI, HI, or AVH.    Current Symptoms: Anxiety and Depressed Mood  Psychiatric Specialty Exam: General Appearance: Casual and Fairly  Groomed  Eye Contact:  Good  Speech:  Clear and Coherent and Normal Rate  Volume:  Normal  Mood:   "ok"  Affect:  Appropriate and Congruent  Thought Process:  Coherent and Goal Directed  Orientation:  Full (Time, Place, and Person)  Thought Content:  WDL and Logical  Suicidal Thoughts:  No  Homicidal Thoughts:  No  Memory:  Immediate;   Good Recent;   Good  Judgement:  Good  Insight:  Good  Psychomotor Activity:  Normal  Concentration:  Concentration: Good and Attention Span: Good  Recall:  Good  Fund of Knowledge:Good  Language: Good  Akathisia:  Negative  Handed:  Right  AIMS (if indicated):  not done  Assets:  Communication Skills Desire for Improvement Housing Resilience Social Support  ADL's:  Intact  Cognition: WNL  Sleep:  Good     Diagnosis: GAD with Panic Disorder, MDD, Recurrent, Severe, w/out Psychosis  Anticipated Frequency of Visits: weekly Anticipated Length of Treatment Episode: 12-16  Short Term Goals/Goals for Treatment Session: Plant flowers bought yesterday. Continue to increase amount of walking each day. Progress Towards Goals: Initial  Treatment Intervention: Cognitive therapy, Insight-oriented therapy, and Supportive therapy  Medical Necessity: Assisted patient to achieve or maintain maximum functional capacity  Assessment Tools:    04/13/2022    9:39 AM  Depression screen PHQ 2/9  Decreased Interest 3  Down, Depressed, Hopeless 3  PHQ - 2 Score 6  Altered sleeping 3  Tired, decreased energy 3  Change in appetite 3  Feeling bad or failure about yourself  3  Trouble concentrating 3  Moving slowly or fidgety/restless 3  Suicidal thoughts 0  PHQ-9 Score 24   No flowsheet data found.   Collaboration of Care:   Patient/Guardian was advised Release of Information must be obtained prior to any record release in order to collaborate their care with an outside provider. Patient/Guardian was advised if they have not already done so to  contact the registration department to sign all necessary forms in order for [place] to release information regarding their care.   Consent: Patient/Guardian gives verbal consent for treatment and assignment of benefits for services provided during this visit. Patient/Guardian expressed understanding and agreed to proceed.   Plan: She will work on mindfulness (living vs being alive) during this next week.  She will continue to work on leaving the house more and walking more.  She will try to plant the flowers she bought with her mother.  Prior to leaving the appointment she confirmed she was in a stable and safe mindset.  She report no SI, HI, or AVH.   Briant Cedar, MD 04/21/2022

## 2022-04-28 ENCOUNTER — Ambulatory Visit (INDEPENDENT_AMBULATORY_CARE_PROVIDER_SITE_OTHER): Payer: No Payment, Other | Admitting: Student in an Organized Health Care Education/Training Program

## 2022-04-28 DIAGNOSIS — F411 Generalized anxiety disorder: Secondary | ICD-10-CM

## 2022-04-28 DIAGNOSIS — F332 Major depressive disorder, recurrent severe without psychotic features: Secondary | ICD-10-CM | POA: Diagnosis not present

## 2022-04-28 DIAGNOSIS — F410 Panic disorder [episodic paroxysmal anxiety] without agoraphobia: Secondary | ICD-10-CM | POA: Diagnosis not present
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
## 2022-05-05 ENCOUNTER — Ambulatory Visit (INDEPENDENT_AMBULATORY_CARE_PROVIDER_SITE_OTHER): Payer: No Payment, Other | Admitting: Student in an Organized Health Care Education/Training Program

## 2022-05-05 DIAGNOSIS — F410 Panic disorder [episodic paroxysmal anxiety] without agoraphobia: Secondary | ICD-10-CM | POA: Diagnosis not present

## 2022-05-05 DIAGNOSIS — F411 Generalized anxiety disorder: Secondary | ICD-10-CM

## 2022-05-05 DIAGNOSIS — F332 Major depressive disorder, recurrent severe without psychotic features: Secondary | ICD-10-CM
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
## 2022-05-12 ENCOUNTER — Other Ambulatory Visit: Payer: Self-pay

## 2022-05-12 ENCOUNTER — Ambulatory Visit (INDEPENDENT_AMBULATORY_CARE_PROVIDER_SITE_OTHER): Payer: No Payment, Other | Admitting: Student in an Organized Health Care Education/Training Program

## 2022-05-12 DIAGNOSIS — F332 Major depressive disorder, recurrent severe without psychotic features: Secondary | ICD-10-CM | POA: Diagnosis not present

## 2022-05-12 DIAGNOSIS — F411 Generalized anxiety disorder: Secondary | ICD-10-CM

## 2022-05-12 DIAGNOSIS — F410 Panic disorder [episodic paroxysmal anxiety] without agoraphobia: Secondary | ICD-10-CM

## 2022-05-12 MED ORDER — MIRTAZAPINE 15 MG PO TABS
15.0000 mg | ORAL_TABLET | Freq: Every day | ORAL | 1 refills | Status: DC
  Filled 2022-05-12: qty 30, 30d supply, fill #0
  Filled 2022-06-13: qty 30, 30d supply, fill #1

## 2022-05-12 MED ORDER — PROPRANOLOL HCL 10 MG PO TABS
10.0000 mg | ORAL_TABLET | Freq: Three times a day (TID) | ORAL | 1 refills | Status: DC
  Filled 2022-05-12: qty 90, 30d supply, fill #0
  Filled 2022-06-13: qty 90, 30d supply, fill #1
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
## 2022-05-17 ENCOUNTER — Other Ambulatory Visit: Payer: Self-pay

## 2022-05-19 ENCOUNTER — Ambulatory Visit (INDEPENDENT_AMBULATORY_CARE_PROVIDER_SITE_OTHER): Payer: No Payment, Other | Admitting: Student in an Organized Health Care Education/Training Program

## 2022-05-19 DIAGNOSIS — F332 Major depressive disorder, recurrent severe without psychotic features: Secondary | ICD-10-CM

## 2022-05-19 DIAGNOSIS — F410 Panic disorder [episodic paroxysmal anxiety] without agoraphobia: Secondary | ICD-10-CM | POA: Diagnosis not present

## 2022-05-19 DIAGNOSIS — F411 Generalized anxiety disorder: Secondary | ICD-10-CM
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
## 2022-05-30 ENCOUNTER — Other Ambulatory Visit: Payer: Self-pay

## 2022-06-09 ENCOUNTER — Ambulatory Visit (INDEPENDENT_AMBULATORY_CARE_PROVIDER_SITE_OTHER): Payer: Medicaid Other | Admitting: Student in an Organized Health Care Education/Training Program

## 2022-06-09 DIAGNOSIS — F410 Panic disorder [episodic paroxysmal anxiety] without agoraphobia: Secondary | ICD-10-CM | POA: Diagnosis not present

## 2022-06-09 DIAGNOSIS — F411 Generalized anxiety disorder: Secondary | ICD-10-CM | POA: Diagnosis not present

## 2022-06-09 DIAGNOSIS — F332 Major depressive disorder, recurrent severe without psychotic features: Secondary | ICD-10-CM

## 2022-06-09 DIAGNOSIS — Z419 Encounter for procedure for purposes other than remedying health state, unspecified: Secondary | ICD-10-CM | POA: Diagnosis not present
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
## 2022-06-13 ENCOUNTER — Other Ambulatory Visit: Payer: Self-pay

## 2022-06-14 ENCOUNTER — Other Ambulatory Visit: Payer: Self-pay

## 2022-06-15 ENCOUNTER — Other Ambulatory Visit: Payer: Self-pay

## 2022-06-16 ENCOUNTER — Encounter (HOSPITAL_COMMUNITY): Payer: Self-pay | Admitting: Student in an Organized Health Care Education/Training Program

## 2022-06-16 ENCOUNTER — Ambulatory Visit (INDEPENDENT_AMBULATORY_CARE_PROVIDER_SITE_OTHER): Payer: Medicaid Other | Admitting: Student in an Organized Health Care Education/Training Program

## 2022-06-16 VITALS — BP 119/84 | HR 75 | Ht 66.5 in | Wt 183.4 lb

## 2022-06-16 DIAGNOSIS — F411 Generalized anxiety disorder: Secondary | ICD-10-CM

## 2022-06-16 DIAGNOSIS — F332 Major depressive disorder, recurrent severe without psychotic features: Secondary | ICD-10-CM

## 2022-06-16 DIAGNOSIS — F410 Panic disorder [episodic paroxysmal anxiety] without agoraphobia: Secondary | ICD-10-CM
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
## 2022-06-23 ENCOUNTER — Ambulatory Visit (HOSPITAL_COMMUNITY): Payer: No Payment, Other | Admitting: Student in an Organized Health Care Education/Training Program

## 2022-07-07 ENCOUNTER — Ambulatory Visit (INDEPENDENT_AMBULATORY_CARE_PROVIDER_SITE_OTHER): Payer: Medicaid Other | Admitting: Student in an Organized Health Care Education/Training Program

## 2022-07-07 DIAGNOSIS — F411 Generalized anxiety disorder: Secondary | ICD-10-CM | POA: Diagnosis not present

## 2022-07-07 DIAGNOSIS — F332 Major depressive disorder, recurrent severe without psychotic features: Secondary | ICD-10-CM

## 2022-07-07 MED ORDER — QUETIAPINE FUMARATE 25 MG PO TABS: 25.0000 mg | ORAL_TABLET | Freq: Two times a day (BID) | ORAL | 0 refills | Status: DC | PRN

## 2022-07-07 NOTE — Progress Notes (Signed)
[place] [address] Dept: [phone] Dept Fax: [phone]  Psychotherapy Progress Note  Patient ID: Sydney Mccoy, female  DOB: 07-22-1970, 51 y.o.  MRN: [id number]  07/07/2022 Start time: 3:33 PM End time: 4:22 PM  Method of Visit: Face-to-Face  Present: patient  Current Concerns:  She reports that things have been significantly worse the past few days.  She reports that she got through Christmas well without any issues but that her anxiety has come back in full force since Wednesday.  She reports that she feels pulled between her boyfriend and her son.  She then reports that she has fears of why her and her boyfriend have not been married despite dating for 9 years, her son will most likely be marrying his girlfriend who has significant health issues he will have to take care of both her and his at that point wife, she reports fears of her mother dying as she is getting older, and the fact that she will be turning 27 and that was when her father passed away.  She then stated she had never said that out loud before.  She did report having some relief at having said those things.  She confirms that her fears over her mother have been getting worse due to the declining health of her boyfriend's mother.  She reports that when she was sleeping down on the couch she had these fears start because she would hear her mother getting up in the morning and because she was so clockwork anytime there is a deviation in this pattern to begin to fear that she will find her mother dead like her brother had found their father.  She reports that now she is sleeping upstairs this is alleviated some because she can no longer here when her mother gets up.  Further discussed her fears of not being able to take care of herself and needing her son to help take care of her.  She reports having issues helping her son navigate  applying to medical school since he has had few interviews and only one exception so far to [place].  She does report that he does have an interview at [employer] in 2 weeks.  She reports her biggest frustration is that she does not understand why her emotions have become so out of control.  Brought up the possibility that multiple life events are seeming to convert at this time and she reports that she does think this is part of it.  She reports feeling like a hamster stuck in a wheel running as hard she can but never going anywhere.  When discussing the possibility of getting off the wheel she reported she never thought of it that way but it always contextual is everything and is trying to get out of the cage but that may be getting off the wheel would be a good start.  She reports that after the New Year's holiday her stress will significantly decrease because her son will be back at work and her boyfriend will be back in [place].  Discussed a short trial of Seroquel for panic symptoms and she was agreeable to a trial.  Prior to leaving the appointment she confirmed she was in a stable and safe mindset.  She reports no SI, HI, or AVH.   Current Symptoms: Anxiety, Depressed Mood, and Family Stress  Psychiatric Specialty Exam: General Appearance: Casual and Fairly Groomed  Eye Contact:  Good  Speech:  Clear and Coherent and Normal Rate  Volume:  Normal  Mood:  Anxious and Depressed  Affect:  Congruent and Tearful  Thought Process:  Coherent and Goal Directed  Orientation:  Full (Time, Place, and Person)  Thought Content:  WDL and Logical  Suicidal Thoughts:  No  Homicidal Thoughts:  No  Memory:  Immediate;   Good Recent;   Good  Judgement:  Good  Insight:  Good  Psychomotor Activity:  Normal  Concentration:  Concentration: Good and Attention Span: Good  Recall:  Good  Fund of Knowledge:Good  Language: Good  Akathisia:  Negative  Handed:  Right  AIMS (if indicated):  not done   Assets:  Communication Skills Desire for Improvement Housing Resilience Social Support  ADL's:  Intact  Cognition: WNL  Sleep:  Fair     Diagnosis: GAD with Panic Disorder, MDD, Recurrent, Severe, w/out Psychosis   Anticipated Frequency of Visits: weekly Anticipated Length of Treatment Episode: 12-16  Short Term Goals/Goals for Treatment Session: Processing grief and feelings  Progress Towards Goals: Progressing  Treatment Intervention: Behavior therapy, Cognitive therapy, Insight-oriented therapy, Relaxation training, and Supportive therapy  Medical Necessity: Prevented onset or worsening of patient condition  Assessment Tools:    06/16/2022   10:15 AM 04/13/2022    9:39 AM  Depression screen PHQ 2/9  Decreased Interest 0 3  Down, Depressed, Hopeless 0 3  PHQ - 2 Score 0 6  Altered sleeping 0 3  Tired, decreased energy 2 3  Change in appetite 0 3  Feeling bad or failure about yourself  2 3  Trouble concentrating 3 3  Moving slowly or fidgety/restless 0 3  Suicidal thoughts 0 0  PHQ-9 Score 7 24  Difficult doing work/chores Somewhat difficult       Collaboration of Care:   Patient/Guardian was advised Release of Information must be obtained prior to any record release in order to collaborate their care with an outside provider. Patient/Guardian was advised if they have not already done so to contact the registration department to sign all necessary forms in order for [place] to release information regarding their care.   Consent: Patient/Guardian gives verbal consent for treatment and assignment of benefits for services provided during this visit. Patient/Guardian expressed understanding and agreed to proceed.   Plan: Provided supportive/talk therapy.  Discussed the convergence of multiple large life events as being the potential source of her increased anxiety.  She did report improvement by the end of the session at having been able to express these things.  Provided  as needed Seroquel for severe issues of anxiety.  Prior to leaving the appointment she confirmed she was in a stable and safe mindset.  She reports no SI, HI, or AVH.   GAD  MDD, Recurrent, Severe, w/out psychosis: -Start Seroquel 25 mg BID PRN severe anxiety.  8 tablets with 0 refills.   Lauro Franklin, MD 07/07/2022

## 2022-07-08 ENCOUNTER — Encounter (HOSPITAL_COMMUNITY): Payer: Self-pay | Admitting: Student in an Organized Health Care Education/Training Program

## 2022-07-10 DIAGNOSIS — Z419 Encounter for procedure for purposes other than remedying health state, unspecified: Secondary | ICD-10-CM | POA: Diagnosis not present

## 2022-07-12 ENCOUNTER — Ambulatory Visit (INDEPENDENT_AMBULATORY_CARE_PROVIDER_SITE_OTHER): Payer: Medicaid Other | Admitting: Student in an Organized Health Care Education/Training Program

## 2022-07-12 DIAGNOSIS — F410 Panic disorder [episodic paroxysmal anxiety] without agoraphobia: Secondary | ICD-10-CM

## 2022-07-12 DIAGNOSIS — F332 Major depressive disorder, recurrent severe without psychotic features: Secondary | ICD-10-CM

## 2022-07-12 DIAGNOSIS — F411 Generalized anxiety disorder: Secondary | ICD-10-CM

## 2022-07-12 NOTE — Progress Notes (Signed)
[place] [address] Dept: [phone] Dept Fax: [phone]  Psychotherapy Progress Note  Patient ID: Sydney Mccoy, female  DOB: Jun 28, 1971, 52 y.o.  MRN: [id number]  07/12/2022 Start time: 9:10 AM End time: 9:52 AM  Method of Visit: Face-to-Face  Present: patient  Current Concerns:  She reports that she was able to get through the rest of the holiday relatively well.  She reports the as needed Seroquel was very helpful.  She reports that after our last appointment she went to dinner with her boyfriend and his family and then picked up the Seroquel from the pharmacy and took 1 that night.  She reports that she fairly quickly fell asleep and had some the best sleep she had in a while and woke up feeling pretty good.  She reports that there is some anxiety this morning due to arguments she frequently has with her mother.  She reports that due to her anxiety she will often have issues with IBS and she reports frustration that her mother will think that she is making them late on purpose.  Discussed that her son and his girlfriend have decided that wherever he gets in the med school she will move with him since she can work remotely.  Discussed one of the major stressors she brought up last time of her boyfriend proposing to her.  She reports that she did get some answers in this regard.  She reports that her boyfriend had been given his grandfathers ring and the plan had been to take the Jewel from that ring and put it into a new ring for her, however, he had given the ring to his mother and she had lost it at her house for a while.  She reports that while he was up here for Christmas Sydney Mccoy was found at his mother's.  She reports that she now has more anxiety over him proposing to her.  She reports that she is concerned that he would see how "dysfunctional" she is.  Talked with her about his potential quirks  that he would have and whether that would affect the way she thinks about him.  She reports that that would not affect how she would think about him and with prompting does admit that she realizes he would not change the way he thinks about her due to her quirks.  She reports that she can understand these connections but often has trouble recognizing them.  Prior to leaving the appointment she confirmed she was in a stable and safe mindset.  She reports no SI, HI, or AVH.   Current Symptoms: Anxiety, Depressed Mood, and Family Stress  Psychiatric Specialty Exam: General Appearance: Casual and Fairly Groomed  Eye Contact:  Good  Speech:  Clear and Coherent and Normal Rate  Volume:  Normal  Mood:  Dysphoric  Affect:  Congruent  Thought Process:  Coherent and Goal Directed  Orientation:  Full (Time, Place, and Person)  Thought Content:  WDL and Logical  Suicidal Thoughts:  No  Homicidal Thoughts:  No  Memory:  Immediate;   Good Recent;   Good  Judgement:  Good  Insight:  Good  Psychomotor Activity:  Normal  Concentration:  Concentration: Good and Attention Span: Good  Recall:  Good  Fund of Knowledge:Good  Language: Good  Akathisia:  Negative  Handed:  Right  AIMS (if indicated):  not done  Assets:  Communication Skills Desire for [place]  Support  ADL's:  Intact  Cognition: WNL  Sleep:  Good     Diagnosis:  GAD with Panic Disorder, MDD, Recurrent, Severe, w/out Psychosis   Anticipated Frequency of Visits: Weekly Anticipated Length of Treatment Episode: 16  Short Term Goals/Goals for Treatment Session: Processing anxiety and feelings  Progress Towards Goals: Progressing  Treatment Intervention: Behavior therapy, Cognitive therapy, Insight-oriented therapy, Relaxation training, and Supportive therapy  Medical Necessity: Prevented onset or worsening of patient condition  Assessment Tools:    06/16/2022   10:15 AM 04/13/2022    9:39 AM   Depression screen PHQ 2/9  Decreased Interest 0 3  Down, Depressed, Hopeless 0 3  PHQ - 2 Score 0 6  Altered sleeping 0 3  Tired, decreased energy 2 3  Change in appetite 0 3  Feeling bad or failure about yourself  2 3  Trouble concentrating 3 3  Moving slowly or fidgety/restless 0 3  Suicidal thoughts 0 0  PHQ-9 Score 7 24  Difficult doing work/chores Somewhat difficult       Collaboration of Care:   Patient/Guardian was advised Release of Information must be obtained prior to any record release in order to collaborate their care with an outside provider. Patient/Guardian was advised if they have not already done so to contact the registration department to sign all necessary forms in order for [place] to release information regarding their care.   Consent: Patient/Guardian gives verbal consent for treatment and assignment of benefits for services provided during this visit. Patient/Guardian expressed understanding and agreed to proceed.   Plan: Provided supportive/talk therapy. Continued to work on anxiety and causes behind it and processing them.  Prior to leaving the appointment she confirmed she was in a stable and safe mindset.  She reports no SI, HI, or AVH.   Briant Cedar, MD 07/12/2022

## 2022-07-16 ENCOUNTER — Other Ambulatory Visit (HOSPITAL_COMMUNITY): Payer: Self-pay | Admitting: Student in an Organized Health Care Education/Training Program

## 2022-07-16 DIAGNOSIS — F411 Generalized anxiety disorder: Secondary | ICD-10-CM

## 2022-07-16 DIAGNOSIS — F410 Panic disorder [episodic paroxysmal anxiety] without agoraphobia: Secondary | ICD-10-CM

## 2022-07-16 DIAGNOSIS — F332 Major depressive disorder, recurrent severe without psychotic features: Secondary | ICD-10-CM

## 2022-07-17 ENCOUNTER — Other Ambulatory Visit: Payer: Self-pay

## 2022-07-17 MED ORDER — MIRTAZAPINE 15 MG PO TABS
15.0000 mg | ORAL_TABLET | Freq: Every day | ORAL | 1 refills | Status: DC
  Filled 2022-07-17: qty 30, 30d supply, fill #0
  Filled 2022-08-16: qty 30, 30d supply, fill #1

## 2022-07-17 MED ORDER — PROPRANOLOL HCL 10 MG PO TABS
10.0000 mg | ORAL_TABLET | Freq: Three times a day (TID) | ORAL | 1 refills | Status: DC
  Filled 2022-07-17: qty 90, 30d supply, fill #0
  Filled 2022-08-16: qty 90, 30d supply, fill #1

## 2022-07-17 NOTE — Telephone Encounter (Signed)
Received request for medication refill from pharmacy.  These were sent.   Sent: -Propanolol 10 mg TID.  90 tablets with 1 refill. -Remeron 15 mg QHS.  30 tablets with 1 refill.     Fatima Sanger MD Resident

## 2022-07-26 ENCOUNTER — Ambulatory Visit (INDEPENDENT_AMBULATORY_CARE_PROVIDER_SITE_OTHER): Payer: Medicaid Other | Admitting: Student in an Organized Health Care Education/Training Program

## 2022-07-26 DIAGNOSIS — F411 Generalized anxiety disorder: Secondary | ICD-10-CM | POA: Diagnosis not present

## 2022-07-26 DIAGNOSIS — F410 Panic disorder [episodic paroxysmal anxiety] without agoraphobia: Secondary | ICD-10-CM | POA: Diagnosis not present

## 2022-07-26 DIAGNOSIS — F332 Major depressive disorder, recurrent severe without psychotic features: Secondary | ICD-10-CM
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
## 2022-08-11 ENCOUNTER — Encounter (HOSPITAL_COMMUNITY): Payer: Self-pay | Admitting: Student in an Organized Health Care Education/Training Program

## 2022-08-11 ENCOUNTER — Ambulatory Visit (INDEPENDENT_AMBULATORY_CARE_PROVIDER_SITE_OTHER): Payer: No Payment, Other | Admitting: Student in an Organized Health Care Education/Training Program

## 2022-08-11 DIAGNOSIS — F410 Panic disorder [episodic paroxysmal anxiety] without agoraphobia: Secondary | ICD-10-CM

## 2022-08-11 DIAGNOSIS — F332 Major depressive disorder, recurrent severe without psychotic features: Secondary | ICD-10-CM

## 2022-08-11 DIAGNOSIS — F411 Generalized anxiety disorder: Secondary | ICD-10-CM
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
## 2022-08-16 ENCOUNTER — Other Ambulatory Visit (HOSPITAL_COMMUNITY): Payer: Self-pay | Admitting: Student in an Organized Health Care Education/Training Program

## 2022-08-16 DIAGNOSIS — F411 Generalized anxiety disorder: Secondary | ICD-10-CM

## 2022-08-16 DIAGNOSIS — F410 Panic disorder [episodic paroxysmal anxiety] without agoraphobia: Secondary | ICD-10-CM

## 2022-08-17 ENCOUNTER — Other Ambulatory Visit: Payer: Self-pay

## 2022-08-17 MED ORDER — HYDROXYZINE HCL 25 MG PO TABS
25.0000 mg | ORAL_TABLET | Freq: Three times a day (TID) | ORAL | 1 refills | Status: DC | PRN
  Filled 2022-08-17: qty 60, 20d supply, fill #0

## 2022-08-17 NOTE — Telephone Encounter (Signed)
Received request from patient's pharmacy for refill of hydroxyzine.  This was sent.   Sent: -Hydroxyzine 25 mg TIC PRN.  60 tablets with 1 refill.    Fatima Sanger MD Resident

## 2022-08-21 ENCOUNTER — Other Ambulatory Visit: Payer: Self-pay

## 2022-08-21 ENCOUNTER — Ambulatory Visit (INDEPENDENT_AMBULATORY_CARE_PROVIDER_SITE_OTHER): Payer: No Payment, Other | Admitting: Student in an Organized Health Care Education/Training Program

## 2022-08-21 DIAGNOSIS — F410 Panic disorder [episodic paroxysmal anxiety] without agoraphobia: Secondary | ICD-10-CM

## 2022-08-21 DIAGNOSIS — F332 Major depressive disorder, recurrent severe without psychotic features: Secondary | ICD-10-CM

## 2022-08-21 DIAGNOSIS — F411 Generalized anxiety disorder: Secondary | ICD-10-CM

## 2022-08-21 MED ORDER — HYDROXYZINE HCL 25 MG PO TABS
25.0000 mg | ORAL_TABLET | Freq: Three times a day (TID) | ORAL | 1 refills | Status: DC | PRN
  Filled 2022-08-21 – 2022-10-31 (×2): qty 60, 20d supply, fill #0
  Filled 2022-11-23: qty 60, 20d supply, fill #1

## 2022-08-21 MED ORDER — MIRTAZAPINE 15 MG PO TABS
15.0000 mg | ORAL_TABLET | Freq: Every day | ORAL | 1 refills | Status: DC
  Filled 2022-08-21 – 2022-09-14 (×2): qty 30, 30d supply, fill #0
  Filled 2022-10-12 – 2022-10-13 (×2): qty 30, 30d supply, fill #1

## 2022-08-21 MED ORDER — PROPRANOLOL HCL 10 MG PO TABS
10.0000 mg | ORAL_TABLET | Freq: Three times a day (TID) | ORAL | 1 refills | Status: DC
  Filled 2022-08-21 – 2022-09-14 (×2): qty 90, 30d supply, fill #0
  Filled 2022-10-12: qty 90, 30d supply, fill #1

## 2022-08-21 MED ORDER — VENLAFAXINE HCL ER 37.5 MG PO CP24
37.5000 mg | ORAL_CAPSULE | Freq: Every day | ORAL | 1 refills | Status: DC
  Filled 2022-08-21: qty 30, 30d supply, fill #0
  Filled 2022-09-14 (×2): qty 30, 30d supply, fill #1

## 2022-08-21 NOTE — Progress Notes (Signed)
  [place] [address] Dept: [phone] Dept Fax: [phone]  Psychotherapy Progress Note  Patient ID: Sydney Mccoy, female  DOB: Mar 16, 1971, 52 y.o.  MRN: [id number]  08/21/2022 Start time: 3:37 PM End time: 4:20 PM  Method of Visit: Face-to-Face  Present: patient  Current Concerns: ***  Current Symptoms: Anxiety  Psychiatric Specialty Exam: General Appearance: Casual and Fairly Groomed  Eye Contact:  Good  Speech:  Clear and Coherent and Normal Rate  Volume:  Normal  Mood:  Anxious  Affect:  Congruent  Thought Process:  Coherent and Goal Directed  Orientation:  Full (Time, Place, and Person)  Thought Content:  WDL and Logical  Suicidal Thoughts:  No  Homicidal Thoughts:  No  Memory:  Immediate;   Good Recent;   Good  Judgement:  Good  Insight:  Good  Psychomotor Activity:  Normal  Concentration:  Concentration: Good and Attention Span: Good  Recall:  Good  Fund of Knowledge:Good  Language: Good  Akathisia:  Negative  Handed:  Right  AIMS (if indicated):  not done  Assets:  Communication Skills Desire for Improvement Housing Resilience Social Support  ADL's:  Intact  Cognition: WNL  Sleep:  Good     Diagnosis: GAD with Panic Disorder, MDD, Recurrent, Severe, w/out Psychosis   Anticipated Frequency of Visits: weekly Anticipated Length of Treatment Episode: 16  Short Term Goals/Goals for Treatment Session: Process anxiety and work on stress reduction techniques Progress Towards Goals: Progressing  Treatment Intervention: Cognitive therapy, Relaxation training, and Supportive therapy  Medical Necessity: Prevented onset or worsening of patient condition  Assessment Tools:    06/16/2022   10:15 AM 04/13/2022    9:39 AM  Depression screen PHQ 2/9  Decreased Interest 0 3  Down, Depressed, Hopeless 0 3  PHQ - 2 Score 0 6  Altered sleeping 0 3  Tired, decreased  energy 2 3  Change in appetite 0 3  Feeling bad or failure about yourself  2 3  Trouble concentrating 3 3  Moving slowly or fidgety/restless 0 3  Suicidal thoughts 0 0  PHQ-9 Score 7 24  Difficult doing work/chores Somewhat difficult       Collaboration of Care:   Patient/Guardian was advised Release of Information must be obtained prior to any record release in order to collaborate their care with an outside provider. Patient/Guardian was advised if they have not already done so to contact the registration department to sign all necessary forms in order for [place] to release information regarding their care.   Consent: Patient/Guardian gives verbal consent for treatment and assignment of benefits for services provided during this visit. Patient/Guardian expressed understanding and agreed to proceed.   Plan: ***  Prior to leaving the appointment she confirmed she was in a stable and safe mindset.  She reports no SI, HI, or AVH.   -Continue Remeron 15 mg QHS for depression, andxiety, sleep, and appetite.  30 tablets with 1 refill. -Start Effexor XR 37.5 mg daily for depression and anxiety.  30 tablets with 1 refill. -Continue Propanolol 10 mg TID for anxiety and HTN.  90 tablets with 1 refill. -Continue Hydroxyzine 25 mg TID PRN for anxiety.  60 tablets with 1 refill. -Continue Seroquel 25 mg BID PRN severe anxiety.  No refills sent at this time.   Briant Cedar, MD 08/21/2022

## 2022-08-22 ENCOUNTER — Other Ambulatory Visit: Payer: Self-pay

## 2022-08-22 ENCOUNTER — Encounter (HOSPITAL_COMMUNITY): Payer: Self-pay | Admitting: Student in an Organized Health Care Education/Training Program

## 2022-08-30 ENCOUNTER — Ambulatory Visit (INDEPENDENT_AMBULATORY_CARE_PROVIDER_SITE_OTHER): Payer: No Payment, Other | Admitting: Student in an Organized Health Care Education/Training Program

## 2022-08-30 DIAGNOSIS — F411 Generalized anxiety disorder: Secondary | ICD-10-CM

## 2022-08-30 DIAGNOSIS — F332 Major depressive disorder, recurrent severe without psychotic features: Secondary | ICD-10-CM

## 2022-08-30 DIAGNOSIS — F410 Panic disorder [episodic paroxysmal anxiety] without agoraphobia: Secondary | ICD-10-CM | POA: Diagnosis not present

## 2022-08-30 NOTE — Progress Notes (Signed)
  [place] [address] Dept: [phone] Dept Fax: [phone]  Psychotherapy Progress Note  Patient ID: RYIAH DEPAS, female  DOB: January 18, 1971, 52 y.o.  MRN: [id number]  08/30/2022 Start time: 11:05 AM End time: 11:50 AM  Method of Visit: Face-to-Face  Present: patient  Current Concerns: ***  Prior to leaving the appointment she confirmed she was in a stable and safe mindset.  She reports no SI, HI, or AVH.   Current Symptoms: Anxiety, Depressed Mood, Family Stress, and Parenting problem  Psychiatric Specialty Exam: General Appearance: Casual and Fairly Groomed  Eye Contact:  Good  Speech:  Clear and Coherent and Normal Rate  Volume:  Normal  Mood:  Anxious  Affect:  Congruent and Tearful  Thought Process:  Coherent and Goal Directed  Orientation:  Full (Time, Place, and Person)  Thought Content:  WDL and Logical  Suicidal Thoughts:  No  Homicidal Thoughts:  No  Memory:  Immediate;   Good Recent;   Good  Judgement:  Good  Insight:  Good  Psychomotor Activity:  Normal  Concentration:  Concentration: Good and Attention Span: Good  Recall:  Good  Fund of Knowledge:Good  Language: Good  Akathisia:  Negative  Handed:  Right  AIMS (if indicated):  not done  Assets:  Communication Skills Desire for Improvement Housing Resilience Social Support  ADL's:  Intact  Cognition: WNL  Sleep:  Good     Diagnosis: GAD with Panic Disorder, MDD, Recurrent, Severe, w/out Psychosis   Anticipated Frequency of Visits: weekly Anticipated Length of Treatment Episode: 16  Short Term Goals/Goals for Treatment Session: Setting/following goals Progress Towards Goals: Progressing  Treatment Intervention: Cognitive Behavioral therapy, Insight-oriented therapy, and Supportive therapy  Medical Necessity: Assisted patient to achieve or maintain maximum functional capacity  Assessment Tools:     06/16/2022   10:15 AM 04/13/2022    9:39 AM  Depression screen PHQ 2/9  Decreased Interest 0 3  Down, Depressed, Hopeless 0 3  PHQ - 2 Score 0 6  Altered sleeping 0 3  Tired, decreased energy 2 3  Change in appetite 0 3  Feeling bad or failure about yourself  2 3  Trouble concentrating 3 3  Moving slowly or fidgety/restless 0 3  Suicidal thoughts 0 0  PHQ-9 Score 7 24  Difficult doing work/chores Somewhat difficult       Collaboration of Care:   Patient/Guardian was advised Release of Information must be obtained prior to any record release in order to collaborate their care with an outside provider. Patient/Guardian was advised if they have not already done so to contact the registration department to sign all necessary forms in order for [place] to release information regarding their care.   Consent: Patient/Guardian gives verbal consent for treatment and assignment of benefits for services provided during this visit. Patient/Guardian expressed understanding and agreed to proceed.   Plan: ***  Prior to leaving the appointment she confirmed she was in a stable and safe mindset.  She reports no SI, HI, or AVH.   Briant Cedar, MD 08/30/2022

## 2022-08-31 ENCOUNTER — Encounter (HOSPITAL_COMMUNITY): Payer: Self-pay | Admitting: Student in an Organized Health Care Education/Training Program

## 2022-09-06 ENCOUNTER — Encounter (HOSPITAL_COMMUNITY): Payer: Self-pay | Admitting: Student in an Organized Health Care Education/Training Program

## 2022-09-06 ENCOUNTER — Ambulatory Visit (INDEPENDENT_AMBULATORY_CARE_PROVIDER_SITE_OTHER): Payer: No Payment, Other | Admitting: Student in an Organized Health Care Education/Training Program

## 2022-09-06 DIAGNOSIS — F332 Major depressive disorder, recurrent severe without psychotic features: Secondary | ICD-10-CM | POA: Diagnosis not present

## 2022-09-06 DIAGNOSIS — F411 Generalized anxiety disorder: Secondary | ICD-10-CM | POA: Diagnosis not present

## 2022-09-06 DIAGNOSIS — F410 Panic disorder [episodic paroxysmal anxiety] without agoraphobia: Secondary | ICD-10-CM | POA: Diagnosis not present
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
## 2022-09-14 ENCOUNTER — Other Ambulatory Visit: Payer: Self-pay

## 2022-09-15 ENCOUNTER — Encounter (HOSPITAL_COMMUNITY): Payer: Self-pay | Admitting: Student in an Organized Health Care Education/Training Program

## 2022-09-15 ENCOUNTER — Ambulatory Visit (INDEPENDENT_AMBULATORY_CARE_PROVIDER_SITE_OTHER): Payer: No Payment, Other | Admitting: Student in an Organized Health Care Education/Training Program

## 2022-09-15 DIAGNOSIS — F332 Major depressive disorder, recurrent severe without psychotic features: Secondary | ICD-10-CM | POA: Diagnosis not present

## 2022-09-15 DIAGNOSIS — F410 Panic disorder [episodic paroxysmal anxiety] without agoraphobia: Secondary | ICD-10-CM

## 2022-09-15 DIAGNOSIS — F411 Generalized anxiety disorder: Secondary | ICD-10-CM | POA: Diagnosis not present

## 2022-09-15 NOTE — Progress Notes (Signed)
[place] [address] Dept: [phone] Dept Fax: [phone]  Psychotherapy Progress Note  Patient ID: Sydney Mccoy, female  DOB: Nov 28, 1970, 52 y.o.  MRN: [id number]  09/15/2022 Start time: 3:02 PM End time: 4:47 PM  Method of Visit: Face-to-Face  Present: patient  Current Concerns:  She reports that overall she has been doing better except when doing self reflection.  She reports reports that her anxiety tends to be about the future.  She reports that it can be about her mother, her son Sydney Mccoy, or her boyfriend Sydney Mccoy.  She reports she continues to have significant enjoyment out of making her miniature objects.  She reports she recently got a big box of food items.  She reports that it is fun but her family sometimes questions it.  Encouraged her to recognize the excitement and change that comes over her when she is talking about them.  Discussed that it is a creative outlet for her and that art therapy can be very helpful.  Also encouraged her to think about her crafts when she does start to have anxiety as a distraction technique.  When asked about her sticking to her schedule with walking she reports being somewhat successful.  She reports walking around the block at 4 PM 2 out of 5 days.  She reports though that the other days she was still doing active things and so was not sitting on the couch which was one of her big goals.  When discussing ways she could be more consistent with the time she reports that she does think setting timers on her phone might help with this.  She reports she will try this.  Prior to leaving the appointment she confirmed she was in a stable and safe mindset.  She reports no SI, HI, or AVH.   Current Symptoms: Anxiety, Depressed Mood, and Family Stress  Psychiatric Specialty Exam: General Appearance: Casual and Fairly Groomed  Eye Contact:  Good  Speech:  Clear and  Coherent and Normal Rate  Volume:  Normal  Mood:  Anxious  Affect:  Congruent  Thought Process:  Coherent and Goal Directed  Orientation:  Full (Time, Place, and Person)  Thought Content:  WDL and Logical  Suicidal Thoughts:  No  Homicidal Thoughts:  No  Memory:  Immediate;   Good Recent;   Good  Judgement:  Good  Insight:  Good  Psychomotor Activity:  Normal  Concentration:  Concentration: Good and Attention Span: Good  Recall:  Good  Fund of Knowledge:Good  Language: Good  Akathisia:  Negative  Handed:  Right  AIMS (if indicated):  not done  Assets:  Communication Skills Desire for Improvement Housing Resilience Social Support  ADL's:  Intact  Cognition: WNL  Sleep:  Good     Diagnosis: GAD with Panic Disorder, MDD, Recurrent, Severe, w/out Psychosis   Anticipated Frequency of Visits: weekly Anticipated Length of Treatment Episode: 16  Short Term Goals/Goals for Treatment Session: More consistent with her schedule and work on Neurosurgeon. Progress Towards Goals: Progressing as evidenced by her increased activity, however, still some inconsistencies present.  Treatment Intervention: Behavior therapy, Relaxation training, and Supportive therapy  Medical Necessity: Prevented onset or worsening of patient condition  Assessment Tools:    06/16/2022   10:15 AM 04/13/2022    9:39 AM  Depression screen PHQ 2/9  Decreased Interest 0 3  Down, Depressed, Hopeless 0 3  PHQ - 2 Score 0 6  Altered sleeping 0 3  Tired, decreased energy 2 3  Change in appetite 0 3  Feeling bad or failure about yourself  2 3  Trouble concentrating 3 3  Moving slowly or fidgety/restless 0 3  Suicidal thoughts 0 0  PHQ-9 Score 7 24  Difficult doing work/chores Somewhat difficult       Collaboration of Care:   Patient/Guardian was advised Release of Information must be obtained prior to any record release in order to collaborate their care with an outside provider.  Patient/Guardian was advised if they have not already done so to contact the registration department to sign all necessary forms in order for [place] to release information regarding their care.   Consent: Patient/Guardian gives verbal consent for treatment and assignment of benefits for services provided during this visit. Patient/Guardian expressed understanding and agreed to proceed.   Plan: Provided Supportive/Talk therapy.  She has been somewhat successful in her scheduling.  She has been able to be more active but only made the 4 PM time twice.  She will begin setting an alarm on her phone to be more consistent.  She will also begin working on distraction techniques when she begins to have anxiety.  Prior to leaving the appointment she confirmed she was in a stable and safe mindset.  She reports no SI, HI, or AVH.   Briant Cedar, MD 09/15/2022

## 2022-09-21 ENCOUNTER — Ambulatory Visit (HOSPITAL_COMMUNITY): Payer: No Payment, Other | Admitting: Student in an Organized Health Care Education/Training Program

## 2022-09-28 ENCOUNTER — Ambulatory Visit (HOSPITAL_COMMUNITY): Payer: Self-pay | Admitting: Student in an Organized Health Care Education/Training Program

## 2022-09-28 ENCOUNTER — Encounter (HOSPITAL_COMMUNITY): Payer: Self-pay

## 2022-09-28 NOTE — Progress Notes (Unsigned)
  [place] [address] Dept: [phone] Dept Fax: [phone]  Psychotherapy Progress Note  Patient ID: Sydney Mccoy, female  DOB: Aug 14, 1970, 52 y.o.  MRN: [id number]  09/28/2022 Start time: *** End time: ***  Method of Visit: Face-to-Face  Present: patient  Current Concerns: ***  Current Symptoms: {Current Symptoms:[phone]}  Psychiatric Specialty Exam: General Appearance: {Appearance:22683}  Eye Contact:  {BHH EYE CONTACT:22684}  Speech:  {Speech:22685}  Volume:  {Volume (PAA):22686}  Mood:  {BHH MOOD:22306}  Affect:  {Affect (PAA):22687}  Thought Process:  {Thought Process (PAA):22688}  Orientation:  {BHH ORIENTATION (PAA):22689}  Thought Content:  {Thought Content:22690}  Suicidal Thoughts:  {ST/HT (PAA):22692}  Homicidal Thoughts:  {ST/HT (PAA):22692}  Memory:  {BHH MEMORY:22881}  Judgement:  {Judgement (PAA):22694}  Insight:  {Insight (PAA):22695}  Psychomotor Activity:  {Psychomotor (PAA):22696}  Concentration:  {Concentration:21399}  Recall:  {BHH GOOD/FAIR/POOR:22877}  Fund of Knowledge:{BHH GOOD/FAIR/POOR:22877}  Language: {BHH GOOD/FAIR/POOR:22877}  Akathisia:  {BHH YES OR NO:22294}  Handed:  Right  AIMS (if indicated):  {Desc; done/not:10129}  Assets:  {Assets (PAA):22698}  ADL's:  {BHH XO:4411959  Cognition: {chl bhh cognition:304700322}  Sleep:  {BHH GOOD/FAIR/POOR:22877}     Diagnosis: GAD with Panic Disorder, MDD, Recurrent, Severe, w/out Psychosis   Anticipated Frequency of Visits: Weekly Anticipated Length of Treatment Episode: 16-20  Short Term Goals/Goals for Treatment Session: *** Progress Towards Goals: {Progress Towards Goals:21014066}  Treatment Intervention: {Treatment [phone]}  Medical Necessity: {Medical Necessity:210140004}  Assessment Tools:    06/16/2022   10:15 AM 04/13/2022    9:39 AM  Depression screen PHQ 2/9   Decreased Interest 0 3  Down, Depressed, Hopeless 0 3  PHQ - 2 Score 0 6  Altered sleeping 0 3  Tired, decreased energy 2 3  Change in appetite 0 3  Feeling bad or failure about yourself  2 3  Trouble concentrating 3 3  Moving slowly or fidgety/restless 0 3  Suicidal thoughts 0 0  PHQ-9 Score 7 24  Difficult doing work/chores Somewhat difficult    Failed to redirect to the Timeline version of the REVFS SmartLink.   Collaboration of Care: {BH OP Collaboration of Care:21014065}  Patient/Guardian was advised Release of Information must be obtained prior to any record release in order to collaborate their care with an outside provider. Patient/Guardian was advised if they have not already done so to contact the registration department to sign all necessary forms in order for [place] to release information regarding their care.   Consent: Patient/Guardian gives verbal consent for treatment and assignment of benefits for services provided during this visit. Patient/Guardian expressed understanding and agreed to proceed.   Plan: ***  Briant Cedar, MD 09/28/2022

## 2022-10-12 ENCOUNTER — Other Ambulatory Visit: Payer: Self-pay

## 2022-10-13 ENCOUNTER — Other Ambulatory Visit: Payer: Self-pay

## 2022-10-13 ENCOUNTER — Other Ambulatory Visit (HOSPITAL_COMMUNITY): Payer: Self-pay | Admitting: Student in an Organized Health Care Education/Training Program

## 2022-10-13 ENCOUNTER — Encounter (HOSPITAL_COMMUNITY): Payer: Self-pay | Admitting: Student in an Organized Health Care Education/Training Program

## 2022-10-13 ENCOUNTER — Ambulatory Visit (INDEPENDENT_AMBULATORY_CARE_PROVIDER_SITE_OTHER): Payer: No Payment, Other | Admitting: Student in an Organized Health Care Education/Training Program

## 2022-10-13 DIAGNOSIS — F411 Generalized anxiety disorder: Secondary | ICD-10-CM | POA: Diagnosis not present

## 2022-10-13 DIAGNOSIS — F332 Major depressive disorder, recurrent severe without psychotic features: Secondary | ICD-10-CM

## 2022-10-13 DIAGNOSIS — F410 Panic disorder [episodic paroxysmal anxiety] without agoraphobia: Secondary | ICD-10-CM

## 2022-10-13 MED ORDER — VENLAFAXINE HCL ER 37.5 MG PO CP24
37.5000 mg | ORAL_CAPSULE | Freq: Every day | ORAL | 1 refills | Status: DC
  Filled 2022-10-13 (×2): qty 30, 30d supply, fill #0
  Filled 2022-11-15 (×2): qty 30, 30d supply, fill #1

## 2022-10-13 NOTE — Progress Notes (Signed)
  [facility] [address] [place] [address] Dept: [phone] Dept Fax: [phone]  Psychotherapy Progress Note  Patient ID: Sydney Mccoy, female  DOB: 12/26/1970, 52 y.o.  MRN: [id number]  10/13/2022 Start time: 3:08 PM End time: 3:48 PM  Method of Visit: Face-to-Face  Present: patient  Current Concerns: ***   Prior to leaving the appointment she confirmed she was in a stable and safe mindset.  She reports no SI, HI, or AVH.   Current Symptoms: Anxiety, Depressed Mood, and Family Stress  Psychiatric Specialty Exam: General Appearance: Casual and Fairly Groomed  Eye Contact:  Good  Speech:  Clear and Coherent and Normal Rate  Volume:  Normal  Mood:  Anxious and Depressed  Affect:  Congruent and Tearful  Thought Process:  Coherent and Goal Directed  Orientation:  Full (Time, Place, and Person)  Thought Content:  WDL and Logical  Suicidal Thoughts:  No  Homicidal Thoughts:  No  Memory:  Immediate;   Good Recent;   Good  Judgement:  Good  Insight:  Good  Psychomotor Activity:  Normal  Concentration:  Concentration: Good and Attention Span: Good  Recall:  Good  Fund of Knowledge:Good  Language: Good  Akathisia:  Negative  Handed:  Right  AIMS (if indicated):  not done  Assets:  Communication Skills Desire for Improvement Housing Resilience Social Support  ADL's:  Intact  Cognition: WNL  Sleep:  Good     Diagnosis: GAD with Panic Disorder, MDD, Recurrent, Severe, w/out Psychosis   Anticipated Frequency of Visits: weekly Anticipated Length of Treatment Episode: 20  Short Term Goals/Goals for Treatment Session: *** Progress Towards Goals: {Progress Towards Goals:21014066}  Treatment Intervention: {Treatment Intervention:[phone]}  Medical Necessity: Prevented onset or worsening of patient condition  Assessment Tools:    06/16/2022   10:15 AM 04/13/2022    9:39 AM  Depression screen PHQ  2/9  Decreased Interest 0 3  Down, Depressed, Hopeless 0 3  PHQ - 2 Score 0 6  Altered sleeping 0 3  Tired, decreased energy 2 3  Change in appetite 0 3  Feeling bad or failure about yourself  2 3  Trouble concentrating 3 3  Moving slowly or fidgety/restless 0 3  Suicidal thoughts 0 0  PHQ-9 Score 7 24  Difficult doing work/chores Somewhat difficult    Failed to redirect to the Timeline version of the REVFS SmartLink.   Collaboration of Care:   Patient/Guardian was advised Release of Information must be obtained prior to any record release in order to collaborate their care with an outside provider. Patient/Guardian was advised if they have not already done so to contact the registration department to sign all necessary forms in order for [place] to release information regarding their care.   Consent: Patient/Guardian gives verbal consent for treatment and assignment of benefits for services provided during this visit. Patient/Guardian expressed understanding and agreed to proceed.   Plan: ***  Prior to leaving the appointment she confirmed she was in a stable and safe mindset.  She reports no SI, HI, or AVH.   Lauro Franklin, MD 10/13/2022

## 2022-10-13 NOTE — Telephone Encounter (Signed)
Received message from patient's pharmacy that she needed a refill of her Effexor.  This was sent in.   Sent: -Effexor XR 37.5 mg daily.  30 tablets with 1 refill.    Arna Snipe MD Resident

## 2022-10-16 ENCOUNTER — Other Ambulatory Visit: Payer: Self-pay

## 2022-10-20 ENCOUNTER — Ambulatory Visit (HOSPITAL_COMMUNITY): Payer: No Payment, Other | Admitting: Student in an Organized Health Care Education/Training Program

## 2022-10-31 ENCOUNTER — Other Ambulatory Visit: Payer: Self-pay

## 2022-11-09 ENCOUNTER — Other Ambulatory Visit (HOSPITAL_COMMUNITY): Payer: Self-pay | Admitting: Student in an Organized Health Care Education/Training Program

## 2022-11-09 DIAGNOSIS — F410 Panic disorder [episodic paroxysmal anxiety] without agoraphobia: Secondary | ICD-10-CM

## 2022-11-09 DIAGNOSIS — F411 Generalized anxiety disorder: Secondary | ICD-10-CM

## 2022-11-09 DIAGNOSIS — F332 Major depressive disorder, recurrent severe without psychotic features: Secondary | ICD-10-CM

## 2022-11-13 ENCOUNTER — Other Ambulatory Visit: Payer: Self-pay

## 2022-11-13 MED ORDER — MIRTAZAPINE 15 MG PO TABS
15.0000 mg | ORAL_TABLET | Freq: Every day | ORAL | 1 refills | Status: DC
  Filled 2022-11-13: qty 30, 30d supply, fill #0
  Filled 2022-12-13 (×2): qty 30, 30d supply, fill #1

## 2022-11-13 MED ORDER — PROPRANOLOL HCL 10 MG PO TABS
10.0000 mg | ORAL_TABLET | Freq: Three times a day (TID) | ORAL | 1 refills | Status: DC
  Filled 2022-11-13: qty 90, 30d supply, fill #0
  Filled 2022-12-13 (×2): qty 90, 30d supply, fill #1

## 2022-11-13 NOTE — Telephone Encounter (Signed)
Received message from patient's pharmacy for refills of patient's Remeron and Propanolol.  These were sent.    Sent: -Remeron 15 mg QHS.  30 tablets with 1 refill. -Propanolol 10 mg TID.  90 tablets with 1 refill.    Arna Snipe MD Resident

## 2022-11-15 ENCOUNTER — Other Ambulatory Visit: Payer: Self-pay

## 2022-11-23 ENCOUNTER — Ambulatory Visit (INDEPENDENT_AMBULATORY_CARE_PROVIDER_SITE_OTHER): Payer: No Payment, Other | Admitting: Student in an Organized Health Care Education/Training Program

## 2022-11-23 DIAGNOSIS — F332 Major depressive disorder, recurrent severe without psychotic features: Secondary | ICD-10-CM

## 2022-11-23 DIAGNOSIS — F1010 Alcohol abuse, uncomplicated: Secondary | ICD-10-CM | POA: Diagnosis not present

## 2022-11-23 DIAGNOSIS — F411 Generalized anxiety disorder: Secondary | ICD-10-CM

## 2022-11-23 DIAGNOSIS — F410 Panic disorder [episodic paroxysmal anxiety] without agoraphobia: Secondary | ICD-10-CM | POA: Diagnosis not present

## 2022-11-23 NOTE — Progress Notes (Addendum)
[facility] [address] [place] [address] Dept: [phone] Dept Fax: [phone]  Psychotherapy Progress Note  Patient ID: Sydney Mccoy, female  DOB: 12/29/1970, 52 y.o.  MRN: [id number]  11/23/2022 Start time: 1:34 PM End time: 2:20 PM  Method of Visit: Face-to-Face  Present: patient  Current Concerns:  She reports that after our last appointment her anxiety began spiking again when her son's trip to [place] with her mother came up.  She reports that she did not realize how hard it would hit her for her to be alone.  She reports that her anxiety significantly worsened again to the point where she was drinking more and more.  She reports she finally realized she needed to stop drinking.  She reports she went to stay with her brother and his girlfriend to detox.  She reports that since then she has only had a single glass of champagne on Mother's Day.  She reports that this is had a significant improvement on her anxiety.  She reports that she realized she was using the alcohol to help her get through the day and not face her anxiety but that it was actually worsening her anxiety.  She reports that since then she has been able to do more around the house and enjoy her hobbies again.  She does report that leading up to this she had been having some right-sided abdominal pain and that she could feel her liver.  Discussed with her that she needed to establish with a PCP to have this further looked into.  Provided her with a list of [facility].  She reports she will call the internal medicine teaching service tomorrow to schedule an appointment.  She reports that she knows she will get better without taking care of herself and that stopping drinking was the first step and continuing to work in therapy is the next part of this.  Prior to leaving the appointment she confirmed she was in a stable and safe mindset.   She reports no SI, HI, or AVH.   Current Symptoms: Anxiety and Depressed Mood  Psychiatric Specialty Exam: General Appearance: Casual and Fairly Groomed  Eye Contact:  Good  Speech:  Clear and Coherent and Normal Rate  Volume:  Normal  Mood:  Dysphoric  Affect:  Congruent and Tearful  Thought Process:  Coherent and Goal Directed  Orientation:  Full (Time, Place, and Person)  Thought Content:  WDL and Logical  Suicidal Thoughts:  No  Homicidal Thoughts:  No  Memory:  Immediate;   Good Recent;   Good  Judgement:  Good  Insight:  Good  Psychomotor Activity:  Normal  Concentration:  Concentration: Good and Attention Span: Good  Recall:  Good  Fund of Knowledge:Good  Language: Good  Akathisia:  Negative  Handed:  Right  AIMS (if indicated):  not done  Assets:  Communication Skills Desire for Improvement Housing Resilience Social Support  ADL's:  Intact  Cognition: WNL  Sleep:  Good     Diagnosis: GAD with Panic Disorder, MDD, Recurrent, Severe, w/out Psychosis, EtOH Abuse  Anticipated Frequency of Visits: weekly  Anticipated Length of Treatment Episode: 20   Short Term Goals/Goals for Treatment Session: Establish sobriety Progress Towards Goals: Initial  Treatment Intervention: Supportive therapy  Medical Necessity: Prevented onset or worsening of patient condition  Assessment Tools:    06/16/2022   10:15 AM 04/13/2022    9:39 AM  Depression screen PHQ 2/9  Decreased Interest 0 3  Down, Depressed, Hopeless 0 3  PHQ - 2 Score 0 6  Altered sleeping 0 3  Tired, decreased energy 2 3  Change in appetite 0 3  Feeling bad or failure about yourself  2 3  Trouble concentrating 3 3  Moving slowly or fidgety/restless 0 3  Suicidal thoughts 0 0  PHQ-9 Score 7 24  Difficult doing work/chores Somewhat difficult    Failed to redirect to the Timeline version of the REVFS SmartLink.   Collaboration of Care:   Patient/Guardian was advised Release of Information must  be obtained prior to any record release in order to collaborate their care with an outside provider. Patient/Guardian was advised if they have not already done so to contact the registration department to sign all necessary forms in order for [place] to release information regarding their care.   Consent: Patient/Guardian gives verbal consent for treatment and assignment of benefits for services provided during this visit. Patient/Guardian expressed understanding and agreed to proceed.   Plan: Provided Supportive/Talk therapy.  She reported that she had self detoxed off EtOH and that she has been feeling better since then.  We will continue to work on maintaining that sobriety and its relationship to her anxiety.  She reports having right-sided abdominal pain during the last few weeks and in the context of her alcohol use this is concerning.  Discussed with her the importance of establishing with a PCP to have this further looked into and she was provided a list of [facility].  She reports she will call to make an appointment for next week.  Prior to leaving the appointment she confirmed she was in a stable and safe mindset.  She reports no SI, HI, or AVH.   Lauro Franklin, MD 11/23/2022

## 2022-11-24 ENCOUNTER — Other Ambulatory Visit: Payer: Self-pay

## 2022-12-07 ENCOUNTER — Encounter (HOSPITAL_COMMUNITY): Payer: Self-pay | Admitting: Student in an Organized Health Care Education/Training Program

## 2022-12-07 ENCOUNTER — Ambulatory Visit (INDEPENDENT_AMBULATORY_CARE_PROVIDER_SITE_OTHER): Payer: No Payment, Other | Admitting: Student in an Organized Health Care Education/Training Program

## 2022-12-07 DIAGNOSIS — F332 Major depressive disorder, recurrent severe without psychotic features: Secondary | ICD-10-CM

## 2022-12-07 DIAGNOSIS — F410 Panic disorder [episodic paroxysmal anxiety] without agoraphobia: Secondary | ICD-10-CM | POA: Diagnosis not present

## 2022-12-07 DIAGNOSIS — F411 Generalized anxiety disorder: Secondary | ICD-10-CM

## 2022-12-07 NOTE — Progress Notes (Signed)
[facility] [address] [place] [address] Dept: [phone] Dept Fax: [phone]  Psychotherapy Progress Note  Patient ID: Sydney Mccoy, female  DOB: June 04, 1971, 52 y.o.  MRN: [id number]  12/07/2022 Start time: 2:11 PM End time: 2:49 PM  Method of Visit: Face-to-Face  Present: patient  Current Concerns:  She reports that she did schedule an appointment with the internal medicine teaching service to establish care with them and her appointment is 6/11.  She reports that she has maintained her sobriety.  She reports that due to this her anxiety and depression have been doing better.  She reports she never realized how much she had taken over her because she was not drinking to get drunk but just to "get over the hump."  She reports she sees much clearer now how much she had taken over things.  She reports she is now more active and doing things she wants to do again instead of just sitting on the couch all day drunk.  She reports she has always "talked to the talk but never walked the walk."  She reports now she has started walking with stopping drinking.  When questioned what her next "walking the walk" would be she reports she did not know.  Suggested being more physically active as it had been her goal several months ago.  She reports she does think this is something she could do and is willing to set the goal of walking around the block daily.   Prior to leaving the appointment she confirmed she was in a stable and safe mindset.  She reports no SI, HI, or AVH.   Current Symptoms: Anxiety and Depressed Mood  Psychiatric Specialty Exam: General Appearance: Casual and Fairly Groomed  Eye Contact:  Good  Speech:  Clear and Coherent and Normal Rate  Volume:  Normal  Mood:  Anxious  Affect:  Congruent  Thought Process:  Coherent and Goal Directed  Orientation:  Full (Time, Place, and Person)  Thought Content:  WDL and  Logical  Suicidal Thoughts:  No  Homicidal Thoughts:  No  Memory:  Immediate;   Good Recent;   Good  Judgement:  Good  Insight:  Good  Psychomotor Activity:  Normal  Concentration:  Concentration: Good and Attention Span: Good  Recall:  Good  Fund of Knowledge:Good  Language: Good  Akathisia:  Negative  Handed:  Right  AIMS (if indicated):  not done  Assets:  Communication Skills Desire for Improvement Housing Resilience Social Support  ADL's:  Intact  Cognition: WNL  Sleep:  Good     Diagnosis: GAD with Panic Disorder, MDD, Recurrent, Severe, w/out Psychosis, EtOH Abuse   Anticipated Frequency of Visits: every 2 weeks Anticipated Length of Treatment Episode: 20  Short Term Goals/Goals for Treatment Session: Maintain Sobriety Progress Towards Goals: Progressing as evidenced by no drinks since our last appointment.  Treatment Intervention: Insight-oriented therapy and Supportive therapy  Medical Necessity: Assisted patient to achieve or maintain maximum functional capacity  Assessment Tools:    06/16/2022   10:15 AM 04/13/2022    9:39 AM  Depression screen PHQ 2/9  Decreased Interest 0 3  Down, Depressed, Hopeless 0 3  PHQ - 2 Score 0 6  Altered sleeping 0 3  Tired, decreased energy 2 3  Change in appetite 0 3  Feeling bad or failure about yourself  2 3  Trouble concentrating 3 3  Moving slowly or fidgety/restless 0 3  Suicidal thoughts  0 0  PHQ-9 Score 7 24  Difficult doing work/chores Somewhat difficult    Failed to redirect to the Timeline version of the REVFS SmartLink.   Collaboration of Care:   Patient/Guardian was advised Release of Information must be obtained prior to any record release in order to collaborate their care with an outside provider. Patient/Guardian was advised if they have not already done so to contact the registration department to sign all necessary forms in order for [place] to release information regarding their care.   Consent:  Patient/Guardian gives verbal consent for treatment and assignment of benefits for services provided during this visit. Patient/Guardian expressed understanding and agreed to proceed.   Plan: Provided talk/supportive therapy.  She has maintained her sobriety and does report that her anxiety and depression have continued to be better.  She has made an appointment to establish with a PCP.  She will reestablish the goal of daily exercise.  Prior to leaving the appointment she confirmed she was in a stable and safe mindset.  She reports no SI, HI, or AVH.   Lauro Franklin, MD 12/07/2022

## 2022-12-13 ENCOUNTER — Other Ambulatory Visit: Payer: Self-pay

## 2022-12-13 ENCOUNTER — Other Ambulatory Visit (HOSPITAL_COMMUNITY): Payer: Self-pay | Admitting: Student in an Organized Health Care Education/Training Program

## 2022-12-13 DIAGNOSIS — F332 Major depressive disorder, recurrent severe without psychotic features: Secondary | ICD-10-CM

## 2022-12-13 DIAGNOSIS — F411 Generalized anxiety disorder: Secondary | ICD-10-CM

## 2022-12-13 MED ORDER — VENLAFAXINE HCL ER 37.5 MG PO CP24
37.5000 mg | ORAL_CAPSULE | Freq: Every day | ORAL | 1 refills | Status: DC
  Filled 2022-12-13: qty 30, 30d supply, fill #0
  Filled 2023-01-12: qty 30, 30d supply, fill #1

## 2022-12-13 NOTE — Telephone Encounter (Signed)
Received request from patient's pharmacy for refill of Effexor.  This was sent in.   Sent: -Effexor XR 37.5 mg daily.  30 capsules with 1 refill.     Arna Snipe MD Resident

## 2022-12-14 ENCOUNTER — Ambulatory Visit (INDEPENDENT_AMBULATORY_CARE_PROVIDER_SITE_OTHER): Payer: No Payment, Other | Admitting: Student in an Organized Health Care Education/Training Program

## 2022-12-14 ENCOUNTER — Other Ambulatory Visit: Payer: Self-pay

## 2022-12-14 ENCOUNTER — Encounter (HOSPITAL_COMMUNITY): Payer: Self-pay | Admitting: Student in an Organized Health Care Education/Training Program

## 2022-12-14 DIAGNOSIS — F410 Panic disorder [episodic paroxysmal anxiety] without agoraphobia: Secondary | ICD-10-CM | POA: Diagnosis not present

## 2022-12-14 DIAGNOSIS — F332 Major depressive disorder, recurrent severe without psychotic features: Secondary | ICD-10-CM | POA: Diagnosis not present

## 2022-12-14 DIAGNOSIS — F411 Generalized anxiety disorder: Secondary | ICD-10-CM

## 2022-12-14 DIAGNOSIS — F1010 Alcohol abuse, uncomplicated: Secondary | ICD-10-CM | POA: Diagnosis not present

## 2022-12-14 NOTE — Progress Notes (Signed)
[facility] [address] [place] [address] Dept: [phone] Dept Fax: [phone]  Psychotherapy Progress Note  Patient ID: Sydney Mccoy, female  DOB: Apr 08, 1971, 52 y.o.  MRN: [id number]  12/14/2022 Start time: 1:07 PM End time: 1:47 PM  Method of Visit: Face-to-Face  Present: patient  Current Concerns:  She reports that she did get into an argument with her mother yesterday due to her appointment being scheduled today.  She reports that this is usually when her mother will have lunch with her mother.  She reports that this is despite the fact that she told her mother a few times that the appointment was scheduled for now.  Worked with her on reducing stress when talking with her mother and to work on excusing herself from conversations that they do not get into arguments.  She reports she is under some more stress currently due to her cousin.  She reports that this cousin underwent a breast biopsy today.  She reports that the grandmother had breast cancer.  Discussed the role genetics can play in various forms of cancer and various forms of treatment.  She reports she has maintained her sobriety and not drank.  She confirms her internal medicine appointment next Tuesday.  Prior to leaving the appointment she confirmed she was in a stable and safe mindset.  She reports no SI, HI, or AVH.   Current Symptoms: Anxiety and Family Stress  Psychiatric Specialty Exam: General Appearance: Casual and Fairly Groomed  Eye Contact:  Good  Speech:  Clear and Coherent and Normal Rate  Volume:  Normal  Mood:  Anxious  Affect:  Congruent  Thought Process:  Coherent and Goal Directed  Orientation:  Full (Time, Place, and Person)  Thought Content:  WDL and Logical  Suicidal Thoughts:  No  Homicidal Thoughts:  No  Memory:  Immediate;   Good Recent;   Good  Judgement:  Good  Insight:  Good  Psychomotor Activity:  Normal   Concentration:  Concentration: Good and Attention Span: Good  Recall:  Good  Fund of Knowledge:Good  Language: Good  Akathisia:  Negative  Handed:  Right  AIMS (if indicated):  not done  Assets:  Communication Skills Desire for Improvement Housing Resilience Social Support  ADL's:  Intact  Cognition: WNL  Sleep:  Good     Diagnosis: GAD with Panic Disorder, MDD, Recurrent, Severe, w/out Psychosis, EtOH Abuse Early Remission  Anticipated Frequency of Visits: every 2 weeks Anticipated Length of Treatment Episode: 20  Short Term Goals/Goals for Treatment Session: Process anxiety and maintain sobriety Progress Towards Goals: Progressing as evidenced by having no drinks and work on diffusing arguments with her mother  Treatment Intervention: Supportive therapy  Medical Necessity: Assisted patient to achieve or maintain maximum functional capacity  Assessment Tools:    06/16/2022   10:15 AM 04/13/2022    9:39 AM  Depression screen PHQ 2/9  Decreased Interest 0 3  Down, Depressed, Hopeless 0 3  PHQ - 2 Score 0 6  Altered sleeping 0 3  Tired, decreased energy 2 3  Change in appetite 0 3  Feeling bad or failure about yourself  2 3  Trouble concentrating 3 3  Moving slowly or fidgety/restless 0 3  Suicidal thoughts 0 0  PHQ-9 Score 7 24  Difficult doing work/chores Somewhat difficult    Failed to redirect to the Timeline version of the REVFS SmartLink.   Collaboration of Care:   Patient/Guardian was advised  Release of Information must be obtained prior to any record release in order to collaborate their care with an outside provider. Patient/Guardian was advised if they have not already done so to contact the registration department to sign all necessary forms in order for [place] to release information regarding their care.   Consent: Patient/Guardian gives verbal consent for treatment and assignment of benefits for services provided during this visit. Patient/Guardian  expressed understanding and agreed to proceed.   Plan: Provided Supportive/Talk therapy.  She has continued to maintain her sobriety.  She has had some increased stress due to her cousins biopsy done today but will be calling her to follow-up.  She will continue to work on the de-escalation to avoid arguments with her mother.  Prior to leaving the appointment she confirmed she was in a stable and safe mindset.  She reports no SI, HI, or AVH.   Lauro Franklin, MD 12/14/2022

## 2022-12-19 ENCOUNTER — Ambulatory Visit: Payer: Self-pay

## 2023-01-01 ENCOUNTER — Encounter (HOSPITAL_COMMUNITY): Payer: Medicaid Other | Admitting: Student in an Organized Health Care Education/Training Program

## 2023-01-01 NOTE — Progress Notes (Signed)
This encounter was created in error - please disregard.

## 2023-01-01 NOTE — Progress Notes (Deleted)
  [facility] [address] [place] [address] Dept: [phone] Dept Fax: [phone]  Psychotherapy Progress Note  Patient ID: Sydney Mccoy, female  DOB: 1970-12-28, 52 y.o.  MRN: [id number]  01/01/2023 Start time: *** PM End time: *** PM  Method of Visit: Face-to-Face  Present: patient  Current Concerns:  She reports ***   Current Symptoms: Anxiety and Family Stress *** Psychiatric Specialty Exam: General Appearance: Casual and Fairly Groomed  Eye Contact:  Good  Speech:  Clear and Coherent and Normal Rate  Volume:  Normal  Mood:  Anxious  Affect:  Congruent  Thought Process:  Coherent and Goal Directed  Orientation:  Full (Time, Place, and Person)  Thought Content:  WDL and Logical  Suicidal Thoughts:  No  Homicidal Thoughts:  No  Memory:  Immediate;   Good Recent;   Good  Judgement:  Good  Insight:  Good  Psychomotor Activity:  Normal  Concentration:  Concentration: Good and Attention Span: Good  Recall:  Good  Fund of Knowledge:Good  Language: Good  Akathisia:  Negative  Handed:  Right  AIMS (if indicated):  not done  Assets:  Communication Skills Desire for Improvement Housing Resilience Social Support  ADL's:  Intact  Cognition: WNL  Sleep:  Good     Diagnosis: GAD with Panic Disorder, MDD, Recurrent, Severe, w/out Psychosis, EtOH Abuse Early Remission  Anticipated Frequency of Visits: every 2 weeks Anticipated Length of Treatment Episode: 20  Short Term Goals/Goals for Treatment Session: *** Progress Towards Goals: Progressing as evidenced by having no drinks and work on diffusing arguments with her mother  Treatment Intervention: Supportive therapy  Medical Necessity: Assisted patient to achieve or maintain maximum functional capacity  Assessment Tools:    06/16/2022   10:15 AM 04/13/2022    9:39 AM  Depression screen PHQ 2/9  Decreased Interest 0 3  Down, Depressed,  Hopeless 0 3  PHQ - 2 Score 0 6  Altered sleeping 0 3  Tired, decreased energy 2 3  Change in appetite 0 3  Feeling bad or failure about yourself  2 3  Trouble concentrating 3 3  Moving slowly or fidgety/restless 0 3  Suicidal thoughts 0 0  PHQ-9 Score 7 24  Difficult doing work/chores Somewhat difficult    Failed to redirect to the Timeline version of the REVFS SmartLink.   Collaboration of Care:   Patient/Guardian was advised Release of Information must be obtained prior to any record release in order to collaborate their care with an outside provider. Patient/Guardian was advised if they have not already done so to contact the registration department to sign all necessary forms in order for [place] to release information regarding their care.   Consent: Patient/Guardian gives verbal consent for treatment and assignment of benefits for services provided during this visit. Patient/Guardian expressed understanding and agreed to proceed.   Plan: Provided Supportive/Talk therapy.  She has continued to maintain her sobriety.  She has had some increased stress due to her cousins biopsy done today but will be calling her to follow-up.  She will continue to work on the de-escalation to avoid arguments with her mother.  Prior to leaving the appointment she confirmed she was in a stable and safe mindset.  She reports no SI, HI, or AVH.   Lauro Franklin, MD 01/01/2023

## 2023-01-08 DIAGNOSIS — Z419 Encounter for procedure for purposes other than remedying health state, unspecified: Secondary | ICD-10-CM | POA: Diagnosis not present

## 2023-01-12 ENCOUNTER — Other Ambulatory Visit: Payer: Self-pay

## 2023-01-12 ENCOUNTER — Other Ambulatory Visit (HOSPITAL_COMMUNITY): Payer: Self-pay | Admitting: Student in an Organized Health Care Education/Training Program

## 2023-01-12 DIAGNOSIS — F332 Major depressive disorder, recurrent severe without psychotic features: Secondary | ICD-10-CM

## 2023-01-12 DIAGNOSIS — F410 Panic disorder [episodic paroxysmal anxiety] without agoraphobia: Secondary | ICD-10-CM

## 2023-01-12 DIAGNOSIS — F411 Generalized anxiety disorder: Secondary | ICD-10-CM

## 2023-01-12 MED ORDER — PROPRANOLOL HCL 10 MG PO TABS
10.0000 mg | ORAL_TABLET | Freq: Three times a day (TID) | ORAL | 1 refills | Status: DC
  Filled 2023-01-12: qty 90, 30d supply, fill #0
  Filled 2023-02-07: qty 90, 30d supply, fill #1

## 2023-01-12 MED ORDER — MIRTAZAPINE 15 MG PO TABS
15.0000 mg | ORAL_TABLET | Freq: Every day | ORAL | 1 refills | Status: DC
  Filled 2023-01-12: qty 30, 30d supply, fill #0
  Filled 2023-02-07: qty 30, 30d supply, fill #1

## 2023-01-12 NOTE — Telephone Encounter (Signed)
Received message from patients pharmacy for refill of her Remeron and Propanolol.  These were sent.    Sent: -Remeron 15 mg QHS.  30 tablets with 1 refill. -Propanolol 10 mg TID.  90 tablets with 1 refill.    Arna Snipe MD Resident

## 2023-01-16 ENCOUNTER — Encounter (HOSPITAL_COMMUNITY): Payer: Self-pay

## 2023-01-16 ENCOUNTER — Ambulatory Visit (HOSPITAL_COMMUNITY)
Admission: RE | Admit: 2023-01-16 | Discharge: 2023-01-16 | Disposition: A | Discharge status: Home / Self Care | Payer: Medicaid Other | Source: Ambulatory Visit | Attending: Family Medicine | Admitting: Family Medicine

## 2023-01-16 ENCOUNTER — Emergency Department (HOSPITAL_COMMUNITY): Payer: Medicaid Other

## 2023-01-16 ENCOUNTER — Emergency Department (HOSPITAL_COMMUNITY)
Admission: EM | Admit: 2023-01-16 | Discharge: 2023-01-16 | Disposition: A | Discharge status: Home / Self Care | Payer: Medicaid Other | Attending: Emergency Medicine | Admitting: Emergency Medicine

## 2023-01-16 VITALS — BP 133/91 | HR 102 | Temp 99.0°F | Resp 16 | Ht 66.5 in | Wt 180.0 lb

## 2023-01-16 DIAGNOSIS — J449 Chronic obstructive pulmonary disease, unspecified: Secondary | ICD-10-CM | POA: Insufficient documentation

## 2023-01-16 DIAGNOSIS — K7460 Unspecified cirrhosis of liver: Secondary | ICD-10-CM | POA: Diagnosis not present

## 2023-01-16 DIAGNOSIS — R932 Abnormal findings on diagnostic imaging of liver and biliary tract: Secondary | ICD-10-CM | POA: Diagnosis not present

## 2023-01-16 DIAGNOSIS — R509 Fever, unspecified: Secondary | ICD-10-CM | POA: Diagnosis not present

## 2023-01-16 DIAGNOSIS — K8020 Calculus of gallbladder without cholecystitis without obstruction: Secondary | ICD-10-CM | POA: Diagnosis not present

## 2023-01-16 DIAGNOSIS — R188 Other ascites: Secondary | ICD-10-CM | POA: Diagnosis not present

## 2023-01-16 DIAGNOSIS — R9431 Abnormal electrocardiogram [ECG] [EKG]: Secondary | ICD-10-CM | POA: Diagnosis not present

## 2023-01-16 DIAGNOSIS — R1011 Right upper quadrant pain: Secondary | ICD-10-CM

## 2023-01-16 DIAGNOSIS — N3001 Acute cystitis with hematuria: Secondary | ICD-10-CM | POA: Diagnosis not present

## 2023-01-16 HISTORY — DX: Diverticulitis of intestine, part unspecified, without perforation or abscess without bleeding: K57.92

## 2023-01-16 HISTORY — DX: Tachycardia, unspecified: R00.0

## 2023-01-16 HISTORY — DX: Chronic obstructive pulmonary disease, unspecified: J44.9

## 2023-01-16 LAB — PROTIME-INR
INR: 1.3 — ABNORMAL HIGH (ref 0.8–1.2)
Prothrombin Time: 15.9 seconds — ABNORMAL HIGH (ref 11.4–15.2)

## 2023-01-16 LAB — CBC
HCT: 38.7 % (ref 36.0–46.0)
Hemoglobin: 12.9 g/dL (ref 12.0–15.0)
MCH: 32.7 pg (ref 26.0–34.0)
MCHC: 33.3 g/dL (ref 30.0–36.0)
MCV: 98.2 fL (ref 80.0–100.0)
Platelets: 135 10*3/uL — ABNORMAL LOW (ref 150–400)
RBC: 3.94 MIL/uL (ref 3.87–5.11)
RDW: 13.6 % (ref 11.5–15.5)
WBC: 7.3 10*3/uL (ref 4.0–10.5)
nRBC: 0 % (ref 0.0–0.2)

## 2023-01-16 LAB — COMPREHENSIVE METABOLIC PANEL
ALT: 33 U/L (ref 0–44)
AST: 31 U/L (ref 15–41)
Albumin: 3.6 g/dL (ref 3.5–5.0)
Alkaline Phosphatase: 66 U/L (ref 38–126)
Anion gap: 9 (ref 5–15)
BUN: 6 mg/dL (ref 6–20)
CO2: 23 mmol/L (ref 22–32)
Calcium: 9 mg/dL (ref 8.9–10.3)
Chloride: 105 mmol/L (ref 98–111)
Creatinine, Ser: 0.56 mg/dL (ref 0.44–1.00)
GFR, Estimated: >60 mL/min (ref >60)
Glucose, Bld: 79 mg/dL (ref 70–99)
Potassium: 4 mmol/L (ref 3.5–5.1)
Sodium: 137 mmol/L (ref 135–145)
Total Bilirubin: 1 mg/dL (ref 0.3–1.2)
Total Protein: 8.3 g/dL — ABNORMAL HIGH (ref 6.5–8.1)

## 2023-01-16 LAB — URINALYSIS, ROUTINE W REFLEX MICROSCOPIC
Bacteria, UA: NONE SEEN
Bilirubin Urine: NEGATIVE
Glucose, UA: NEGATIVE mg/dL
Ketones, ur: NEGATIVE mg/dL
Nitrite: NEGATIVE
Protein, ur: NEGATIVE mg/dL
Specific Gravity, Urine: 1.023 (ref 1.005–1.030)
pH: 5 (ref 5.0–8.0)

## 2023-01-16 LAB — POCT URINALYSIS DIP (MANUAL ENTRY)
Bilirubin, UA: NEGATIVE
Glucose, UA: NEGATIVE mg/dL
Ketones, POC UA: NEGATIVE mg/dL
Leukocytes, UA: NEGATIVE
Nitrite, UA: NEGATIVE
Protein Ur, POC: NEGATIVE mg/dL
Spec Grav, UA: 1.015 (ref 1.010–1.025)
Urobilinogen, UA: 0.2 E.U./dL
pH, UA: 6 (ref 5.0–8.0)

## 2023-01-16 LAB — LIPASE, BLOOD: Lipase: 50 U/L (ref 11–51)

## 2023-01-16 LAB — POCT URINE PREGNANCY: Preg Test, Ur: NEGATIVE

## 2023-01-16 MED ORDER — LACTATED RINGERS IV BOLUS
1000.0000 mL | Freq: Once | INTRAVENOUS | Status: AC
  Administered 2023-01-16: 1000 mL via INTRAVENOUS

## 2023-01-16 MED ORDER — HYDROMORPHONE HCL 1 MG/ML IJ SOLN
1.0000 mg | Freq: Once | INTRAMUSCULAR | Status: AC
  Administered 2023-01-16: 1 mg via INTRAVENOUS
  Filled 2023-01-16: qty 1

## 2023-01-16 MED ORDER — ONDANSETRON HCL 4 MG/2ML IJ SOLN
4.0000 mg | Freq: Once | INTRAMUSCULAR | Status: AC
  Administered 2023-01-16: 4 mg via INTRAVENOUS
  Filled 2023-01-16: qty 2

## 2023-01-16 MED ORDER — IOHEXOL 350 MG/ML SOLN
75.0000 mL | Freq: Once | INTRAVENOUS | Status: AC | PRN
  Administered 2023-01-16: 75 mL via INTRAVENOUS

## 2023-01-16 MED ORDER — HYDROMORPHONE HCL 1 MG/ML IJ SOLN
0.5000 mg | Freq: Once | INTRAMUSCULAR | Status: AC
  Administered 2023-01-16: 0.5 mg via INTRAVENOUS
  Filled 2023-01-16: qty 1

## 2023-01-16 MED ORDER — SODIUM CHLORIDE 0.9 % IV SOLN
1.0000 g | Freq: Once | INTRAVENOUS | Status: AC
  Administered 2023-01-16: 1 g via INTRAVENOUS
  Filled 2023-01-16: qty 10

## 2023-01-16 MED ORDER — ONDANSETRON HCL 4 MG PO TABS: 4.0000 mg | ORAL_TABLET | Freq: Three times a day (TID) | ORAL | 0 refills | Status: AC | PRN

## 2023-01-16 MED ORDER — OXYCODONE HCL 5 MG PO TABS: 5.0000 mg | ORAL_TABLET | Freq: Four times a day (QID) | ORAL | 0 refills | Status: AC | PRN

## 2023-01-16 MED ORDER — KETOROLAC TROMETHAMINE 15 MG/ML IJ SOLN
15.0000 mg | Freq: Once | INTRAMUSCULAR | Status: AC
  Administered 2023-01-16: 15 mg via INTRAVENOUS
  Filled 2023-01-16: qty 1

## 2023-01-16 MED ORDER — CEPHALEXIN 500 MG PO CAPS: 500.0000 mg | ORAL_CAPSULE | Freq: Three times a day (TID) | ORAL | 0 refills | Status: AC

## 2023-01-16 NOTE — ED Provider Notes (Addendum)
[facility]   [id number] 01/16/23 Arrival Time: 1416  ASSESSMENT & PLAN:  1. Right upper quadrant abdominal pain   2. Fever, unspecified fever cause    Cannot r/o cholelithiasis/cholecystitis. Very TTP over RUQ. To ED for further evaluation; via POV; stable upon discharge.  Results for orders placed or performed during the hospital encounter of 01/16/23  POC urinalysis dipstick  Result Value Ref Range   Color, UA yellow yellow   Clarity, UA clear clear   Glucose, UA negative negative mg/dL   Bilirubin, UA negative negative   Ketones, POC UA negative negative mg/dL   Spec Grav, UA 4.098 1.191 - 1.025   Blood, UA large (A) negative   pH, UA 6.0 5.0 - 8.0   Protein Ur, POC negative negative mg/dL   Urobilinogen, UA 0.2 0.2 or 1.0 E.U./dL   Nitrite, UA Negative Negative   Leukocytes, UA Negative Negative  POCT urine pregnancy  Result Value Ref Range   Preg Test, Ur Negative Negative     Follow-up Information     Go to  [facility] Emergency Department at [facility].   Specialty: Emergency Medicine Contact information: [address] [id number] mc [place] [address] [phone]               Reviewed expectations re: course of current medical issues. Questions answered. Outlined signs and symptoms indicating need for more acute intervention. Patient verbalized understanding. After Visit Summary given.   SUBJECTIVE: History from: patient. Sydney Mccoy is a 52 y.o. female who presents with complaint of non-radiating RUQ pain; first noted 3 d ago; Tmax 101.54F; around 73F since. OTC analgesics without much help. Pain keeping her awake at night. With nausea; no emesis. With associated non-bloody loose stools. Decreased appetite. Denies heavy alcohol use.  Past Surgical History:  Procedure Laterality Date   ABDOMINAL HERNIA REPAIR     INSERTION OF MESH     OOPHORECTOMY Left    PATELLA FRACTURE SURGERY Left     Patient's last menstrual period was 03/25/2014 (approximate).  OBJECTIVE:  Vitals:   01/16/23 1445  BP: (!) 133/91  Pulse: (!) 102  Resp: 16  Temp: 99 F (37.2 C)  TempSrc: Oral  SpO2: 94%  Weight: 81.6 kg  Height: 5' 6.5" (1.689 m)    Slight tachycardia noted. General appearance: alert, oriented, no acute distress but appears uncomfortable HEENT: [place]; AT; oropharynx moist Lungs: unlabored respirations Abdomen: soft; without distention; significant and well localized tenderness to palpation over RUQ ; without masses or organomegaly; with voluntary guarding Back: without CVA tenderness; FROM at waist Extremities: without LE edema; symmetrical; without gross deformities Skin: warm and dry Neurologic: normal gait Psychological: alert and cooperative; normal mood and affect  Labs: Results for orders placed or performed during the hospital encounter of 01/16/23  POC urinalysis dipstick  Result Value Ref Range   Color, UA yellow yellow   Clarity, UA clear clear   Glucose, UA negative negative mg/dL   Bilirubin, UA negative negative   Ketones, POC UA negative negative mg/dL   Spec Grav, UA 3.244 0.102 - 1.025   Blood, UA large (A) negative   pH, UA 6.0 5.0 - 8.0   Protein Ur, POC negative negative mg/dL   Urobilinogen, UA 0.2 0.2 or 1.0 E.U./dL   Nitrite, UA Negative Negative   Leukocytes, UA Negative Negative  POCT urine pregnancy  Result Value Ref Range   Preg Test, Ur Negative Negative   Labs Reviewed  POCT  URINALYSIS DIP (MANUAL ENTRY) - Abnormal; Notable for the following components:      Result Value   Blood, UA large (*)    All other components within normal limits  POCT URINE PREGNANCY    Imaging: No results found.   Not on File                                             Past Medical History:  Diagnosis Date   COPD (chronic obstructive pulmonary disease) (HCC)    Diverticulitis    Tachycardia     Social History   Socioeconomic History    Marital status: Divorced    Spouse name: Not on file   Number of children: Not on file   Years of education: Not on file   Highest education level: Not on file  Occupational History   Not on file  Tobacco Use   Smoking status: Former    Types: Cigarettes   Smokeless tobacco: Never  Vaping Use   Vaping Use: Never used  Substance and Sexual Activity   Alcohol use: Yes    Alcohol/week: 28.0 standard drinks of alcohol    Types: 28 Standard drinks or equivalent per week   Drug use: Yes    Comment: CBD oil   Sexual activity: Not on file  Other Topics Concern   Not on file  Social History Narrative   Not on file   Social Determinants of Health   Financial Resource Strain: Not on file  Food Insecurity: Not on file  Transportation Needs: Not on file  Physical Activity: Not on file  Stress: Not on file  Social Connections: Not on file  Intimate Partner Violence: Not on file    Family History  Problem Relation Age of Onset   Heart attack Father      Mardella Layman, MD 01/16/23 1546    Mardella Layman, MD 01/16/23 1626

## 2023-01-16 NOTE — ED Triage Notes (Signed)
Pt has had RUQ pain since Friday night, Has Hx of diverticulitis and hernias, Pt also has been running a reported fever of 101.3 at home but current afebrile. Does mention taking motrin and tylenol at home. Motrin was taken last night and same for tylenol. No excessive amounts of alcohol but does have a Hx of fatty liver.

## 2023-01-16 NOTE — Discharge Instructions (Signed)
Thank you for letting us take care of you today.  Your CT scan showed changes of cirrhosis to your liver. Your liver enzymes are reassuring so I think we can discharge you home, however, it is very important to follow up for further workup and management of this. I recommend establishing care with a PCP. I provided 2 clinics or you may go to a PCP of your own choosing. I am also referring you to GI or the specialists of the digestive tract for evaluation. You should hear with them within 72 hours to schedule an appointment.  If you do not hear from them, call their office to schedule a follow-up appointment.  Your urine did appear infected.  We gave you dose of antibiotics in the ED to treat this.  Please complete the antibiotics prescribed for home.  I am also sending you home with medication to help with pain as well as nausea as needed.  You should avoid use of Tylenol due to the changes seen in your liver.  You should also refrain from any alcohol use.  For new or worsening symptoms such as those discussed today including swelling in your abdomen, large amounts of weight gain, fever, severe uncontrollable abdominal pain or vomiting, or other worsening condition, return to the nearest ED for reevaluation.

## 2023-01-16 NOTE — ED Notes (Signed)
Patient is being discharged from the Urgent Care and sent to the Emergency Department via POV . Per Mardella Layman, MD, patient is in need of higher level of care due to RUQ pain. Patient is aware and verbalizes understanding of plan of care.  Vitals:   01/16/23 1445  BP: (!) 133/91  Pulse: (!) 102  Resp: 16  Temp: 99 F (37.2 C)  SpO2: 94%

## 2023-01-16 NOTE — ED Provider Notes (Signed)
Sydney Mccoy Provider Note   CSN: [id number] Arrival date & time: 01/16/23  1549     History {Add pertinent medical, surgical, social history, OB history to HPI:1} Chief Complaint  Patient presents with   Abdominal Pain    Sydney Mccoy is a 52 y.o. female with past medical history of COPD, diverticulitis who presents to the ED complaining of right-sided abdominal pain for the last 4 days.  She states that it is intermittent but severe in nature.  Associated fever of 101.3 Fahrenheit at home.  No associated nausea, vomiting, dysuria or hematuria.  She has had a few episodes of loose stools.  No recent antibiotics.  States that pain is worse to the right upper quadrant.  Previous abdominal surgeries include hernia repair, ostomy following colon perforation.  These for approximately 17+ years ago. No history of kidney problems.  History of fatty liver disease.  Occasional, moderate consumption of alcohol.  No medication taken for symptoms at home today.      Home Medications Prior to Admission medications   Medication Sig Start Date End Date Taking? Authorizing Provider  hydrOXYzine (ATARAX) 25 MG tablet Take 1 tablet (25 mg total) by mouth 3 (three) times daily as needed. 08/21/22   Lauro Franklin, MD  mirtazapine (REMERON) 15 MG tablet Take 1 tablet (15 mg total) by mouth at bedtime. 01/12/23   Lauro Franklin, MD  propranolol (INDERAL) 10 MG tablet Take 1 tablet (10 mg total) by mouth 3 (three) times daily. 01/12/23   Lauro Franklin, MD  venlafaxine XR (EFFEXOR XR) 37.5 MG 24 hr capsule Take 1 capsule (37.5 mg total) by mouth daily. 12/13/22   Lauro Franklin, MD      Allergies    Patient has no allergy information on record.    Review of Systems   Review of Systems  All other systems reviewed and are negative.   Physical Exam Updated Vital Signs BP (!) 128/93 (BP Location: Right Arm)   Pulse 87   Temp 98.4 F  (36.9 C) (Oral)   Resp 15   LMP 03/25/2014 (Approximate)   SpO2 98%  Physical Exam Vitals and nursing note reviewed.  Constitutional:      General: She is not in acute distress.    Appearance: Normal appearance. She is not ill-appearing or toxic-appearing.  HENT:     Head: Normocephalic and atraumatic.     Mouth/Throat:     Mouth: Mucous membranes are moist.  Eyes:     General: No scleral icterus.    Extraocular Movements: Extraocular movements intact.     Conjunctiva/sclera: Conjunctivae normal.     Pupils: Pupils are equal, round, and reactive to light.  Cardiovascular:     Rate and Rhythm: Normal rate and regular rhythm.     Heart sounds: No murmur heard. Pulmonary:     Effort: Pulmonary effort is normal.     Breath sounds: Normal breath sounds.  Abdominal:     General: Abdomen is flat.     Palpations: Abdomen is soft.     Tenderness: There is abdominal tenderness in the right upper quadrant and right lower quadrant. There is no right CVA tenderness, left CVA tenderness, guarding or rebound. Negative signs include Rovsing's sign and McBurney's sign.  Musculoskeletal:        General: Normal range of motion.     Cervical back: Neck supple.     Right lower leg: No edema.  Left lower leg: No edema.  Skin:    General: Skin is warm and dry.     Capillary Refill: Capillary refill takes less than 2 seconds.     Coloration: Skin is not jaundiced or pale.     Findings: No rash.  Neurological:     General: No focal deficit present.     Mental Status: She is alert and oriented to person, place, and time. Mental status is at baseline.  Psychiatric:        Mood and Affect: Mood normal.        Behavior: Behavior normal.     ED Results / Procedures / Treatments   Labs (all labs ordered are listed, but only abnormal results are displayed) Labs Reviewed  COMPREHENSIVE METABOLIC PANEL - Abnormal; Notable for the following components:      Result Value   Total Protein 8.3 (*)     All other components within normal limits  CBC - Abnormal; Notable for the following components:   Platelets 135 (*)    All other components within normal limits  URINALYSIS, ROUTINE W REFLEX MICROSCOPIC - Abnormal; Notable for the following components:   Color, Urine AMBER (*)    Hgb urine dipstick SMALL (*)    Leukocytes,Ua MODERATE (*)    All other components within normal limits  LIPASE, BLOOD    EKG None  Radiology No results found.  Procedures Procedures  {Document cardiac monitor, telemetry assessment procedure when appropriate:1}  Medications Ordered in ED Medications  HYDROmorphone (DILAUDID) injection 1 mg (1 mg Intravenous Given 01/16/23 2015)  ondansetron (ZOFRAN) injection 4 mg (4 mg Intravenous Given 01/16/23 2014)  lactated ringers bolus 1,000 mL (1,000 mLs Intravenous New Bag/Given 01/16/23 2015)    ED Course/ Medical Decision Making/ A&P   {   Click here for ABCD2, HEART and other calculatorsREFRESH Note before signing :1}                          Medical Decision Making Amount and/or Complexity of Data Reviewed Labs: ordered. Decision-making details documented in ED Course. Radiology: ordered. Decision-making details documented in ED Course. ECG/medicine tests: ordered. Decision-making details documented in ED Course.   Medical Decision Making:   Sydney Mccoy is a 53 y.o. female who presented to the ED today with abdominal pain detailed above.    Additional history discussed with patient's family/caregivers.  Complete initial physical exam performed, notably the patient  was in no acute distress.  She had moderate right upper quadrant abdominal tenderness, mild right lower quadrant abdominal tenderness.  No CVA tenderness.  Nontoxic-appearing.    Reviewed and confirmed nursing documentation for past medical history, family history, social history.    Initial Assessment:   With the patient's presentation of abdominal pain, differential diagnosis  includes but is not limited to AAA, mesenteric ischemia, appendicitis, diverticulitis, DKA, gastritis, gastroenteritis, AMI, nephrolithiasis, pancreatitis, peritonitis, adrenal insufficiency, intestinal ischemia, constipation, UTI, SBO/LBO, splenic rupture, biliary disease, IBD, IBS, PUD, hepatitis, STD, ovarian/testicular torsion, electrolyte disturbance, DKA, dehydration, acute kidney injury, renal failure, cholecystitis, cholelithiasis, choledocholithiasis, abdominal pain of  unknown etiology.   Initial Plan:  Screening labs including CBC and Metabolic panel to evaluate for infectious or metabolic etiology of disease.  Lipase to evaluate for pancreatitis Urinalysis with reflex culture ordered to evaluate for UTI or relevant urologic/nephrologic pathology.  CT abd/pelvis to evaluate for intra-abdominal pathology EKG to evaluate for cardiac pathology Symptomatic management Objective evaluation as reviewed  Initial Study Results:   Laboratory  All laboratory results reviewed without evidence of clinically relevant pathology.   Exceptions include: ***   EKG EKG was reviewed independently. ST segments without concerns for elevations.   EKG: {ekg findings:315101}.   Radiology:  All images reviewed independently. Agree with radiology report at this time.   No results found.    Consults: Case discussed with ***.   Final Assessment and Plan:   ***    Clinical Impression:  1. Acute cystitis with hematuria      Data Unavailable    {Document critical care time when appropriate:1} {Document review of labs and clinical decision tools ie heart score, Chads2Vasc2 etc:1}  {Document your independent review of radiology images, and any outside records:1} {Document your discussion with family members, caretakers, and with consultants:1} {Document social determinants of health affecting pt's care:1} {Document your decision making why or why not admission, treatments were needed:1} Final  Clinical Impression(s) / ED Diagnoses Final diagnoses:  Acute cystitis with hematuria    Rx / DC Orders ED Discharge Orders     None

## 2023-01-16 NOTE — ED Triage Notes (Signed)
Patient here today with c/o RUQ abd pain since Friday night. She has a h/o diverticulitis and hernias. Saturday she had a temp of 101.3. Taking Tylenol and IBU.

## 2023-01-19 ENCOUNTER — Encounter (HOSPITAL_COMMUNITY): Payer: Self-pay | Admitting: Student in an Organized Health Care Education/Training Program

## 2023-01-19 ENCOUNTER — Ambulatory Visit (INDEPENDENT_AMBULATORY_CARE_PROVIDER_SITE_OTHER): Payer: Medicaid Other | Admitting: Student in an Organized Health Care Education/Training Program

## 2023-01-19 DIAGNOSIS — F329 Major depressive disorder, single episode, unspecified: Secondary | ICD-10-CM | POA: Insufficient documentation

## 2023-01-19 DIAGNOSIS — F411 Generalized anxiety disorder: Secondary | ICD-10-CM

## 2023-01-19 DIAGNOSIS — F332 Major depressive disorder, recurrent severe without psychotic features: Secondary | ICD-10-CM

## 2023-01-19 DIAGNOSIS — F410 Panic disorder [episodic paroxysmal anxiety] without agoraphobia: Secondary | ICD-10-CM | POA: Diagnosis not present

## 2023-01-19 DIAGNOSIS — F1010 Alcohol abuse, uncomplicated: Secondary | ICD-10-CM

## 2023-01-19 NOTE — Progress Notes (Signed)
I reviewed the patient's chart and plan as documented in the resident's note.    Sydney Mccoy [name]

## 2023-01-19 NOTE — Progress Notes (Signed)
[facility] [address] [place] [address] Dept: [phone] Dept Fax: [phone]  Psychotherapy Progress Note  Patient ID: Sydney Mccoy, female  DOB: [birth year], 52 y.o.  MRN: [id number]  01/19/2023 Start time: 10:02 AM End time: 10:48 AM  Method of Visit: Face-to-Face  Present: patient  Current Concerns:  She reports that she recently had a very significant health issue.  She reports that last week she was helping her son Sydney Mccoy move from [place] to [place] for medical school.  She reports that a few days prior to this she began having some abdominal pain which continued through the move and passed it.  She reports that the pain continued to become worse and worse.  She reports that eventually she had a fever and at that point realized she needed to go see someone.  She reports that she went to the urgent care but knew most likely she would have to go to the emergency room which she did and was found that she had cirrhosis of her liver.  She reports that she did expect something like this as she has been having these abdominal pains off and on since last summer and knew it would be tied to her drinking.  She reports that while she was helping Sydney Mccoy move one of his roommates who had also been having mental health issues got a job.  She reports that it was a job at [employer] and the way they were talking about it made her extremely self-conscious because this is the job she has been having so much trouble even applying for.  She reports that she does not know who she is anymore and has a lot of self doubts about being answering that.  Encouraged her to reflect on this and that we would continue to work on this going forward.  Prior to leaving the appointment she confirmed she was in a stable and safe mindset.  She reports no SI, HI, or AVH.   Current Symptoms: Anxiety and Depressed Mood  Psychiatric Specialty Exam: General  Appearance: Casual and Fairly Groomed  Eye Contact:  Good  Speech:  Clear and Coherent and Normal Rate  Volume:  Normal  Mood:  Anxious and Dysphoric  Affect:  Congruent and Tearful  Thought Process:  Coherent and Goal Directed  Orientation:  Full (Time, Place, and Person)  Thought Content:  WDL and Logical  Suicidal Thoughts:  No  Homicidal Thoughts:  No  Memory:  Immediate;   Good Recent;   Good  Judgement:  Good  Insight:  Good  Psychomotor Activity:  Normal  Concentration:  Concentration: Good and Attention Span: Good  Recall:  Good  Fund of Knowledge:Good  Language: Good  Akathisia:  Negative  Handed:  Right  AIMS (if indicated):  not done  Assets:  Communication Skills Desire for Improvement Housing Resilience Social Support  ADL's:  Intact  Cognition: WNL  Sleep:  Good     Diagnosis: GAD with Panic Disorder, MDD, Recurrent, Severe, w/out Psychosis, EtOH Abuse   Anticipated Frequency of Visits: every 2 weeks Anticipated Length of Treatment Episode: 20  Short Term Goals/Goals for Treatment Session: work on defining self Progress Towards Goals: Initial   Treatment Intervention: Insight-oriented therapy and Supportive therapy  Medical Necessity: Prevented onset or worsening of patient condition  Assessment Tools:    06/16/2022   10:15 AM 04/13/2022    9:39 AM  Depression screen PHQ 2/9  Decreased Interest 0 3  Down, Depressed, Hopeless 0 3  PHQ - 2 Score 0 6  Altered sleeping 0 3  Tired, decreased energy 2 3  Change in appetite 0 3  Feeling bad or failure about yourself  2 3  Trouble concentrating 3 3  Moving slowly or fidgety/restless 0 3  Suicidal thoughts 0 0  PHQ-9 Score 7 24  Difficult doing work/chores Somewhat difficult    Failed to redirect to the Timeline version of the REVFS SmartLink. Flowsheet Row ED from 01/16/2023 in [facility] Emergency Department at [facility] Most recent reading at 01/16/2023  4:10 PM ED from 01/16/2023 in [facility] Urgent Care at [facility] Most recent reading at 01/16/2023  2:53 PM  C-SSRS RISK CATEGORY No Risk No Risk       Collaboration of Care:   Patient/Guardian was advised Release of Information must be obtained prior to any record release in order to collaborate their care with an outside provider. Patient/Guardian was advised if they have not already done so to contact the registration department to sign all necessary forms in order for [place] to release information regarding their care.   Consent: Patient/Guardian gives verbal consent for treatment and assignment of benefits for services provided during this visit. Patient/Guardian expressed understanding and agreed to proceed.   Plan: Provided talk/supportive therapy.  She had significant medical events that stemmed from her drinking recently.  She does have significant stress over her own self-image and how she would find herself.  She will reflect on this and we will continue to work on this.  Prior to leaving the appointment she confirmed she was in a stable and safe mindset.  She reports no SI, HI, or AVH.    Lauro Franklin, MD 01/19/2023

## 2023-01-25 ENCOUNTER — Encounter: Payer: Self-pay | Admitting: Gastroenterology

## 2023-02-02 ENCOUNTER — Encounter (HOSPITAL_COMMUNITY): Payer: Self-pay | Admitting: Student in an Organized Health Care Education/Training Program

## 2023-02-02 ENCOUNTER — Ambulatory Visit (INDEPENDENT_AMBULATORY_CARE_PROVIDER_SITE_OTHER): Payer: Medicaid Other | Admitting: Student in an Organized Health Care Education/Training Program

## 2023-02-02 DIAGNOSIS — F411 Generalized anxiety disorder: Secondary | ICD-10-CM

## 2023-02-02 DIAGNOSIS — F332 Major depressive disorder, recurrent severe without psychotic features: Secondary | ICD-10-CM | POA: Diagnosis not present

## 2023-02-02 DIAGNOSIS — F410 Panic disorder [episodic paroxysmal anxiety] without agoraphobia: Secondary | ICD-10-CM

## 2023-02-02 NOTE — Progress Notes (Signed)
[facility] [address] [place] [address] Dept: [phone] Dept Fax: [phone]  Psychotherapy Progress Note  Patient ID: Sydney Mccoy, female  DOB: [birth year], 52 y.o.  MRN: [id number]  02/02/2023 Start time: 8:34 AM End time: 9:17 AM  Method of Visit: Face-to-Face  Present: patient  Current Concerns:  She reports a significant stressor is the rapid mental decline of her boyfriend Adams mother.  She reports that his recent visit was the first time he saw his mother since May and he was taken aback by how significant it was.  She reports that she is trying to help by offering to spend time at his parent's house.  She reports that she usually is torn between spending time with her son [name] and boyfriend Madelaine Bhat but that this time there was enough difference and timings that it worked out well.  She reports a concern of hers today is having to try to return her glasses.  She reports she recently bought 2 pairs of glasses but they are the wrong type and so she has been worried about this.  Assisted her in reframing her perspective on the situation and she feels like she will be able to do the return.  She reports that 1 big concern for her is she always feels like she is waiting for "the other shoe to drop."  She reports it keeps not happening but she continues to have this trepidation that something will happen.  Prior to leaving the appointment she confirmed she was in a stable and safe mindset.  She reports no SI, HI, or AVH.   Current Symptoms: Anxiety, Family Stress, and Organization problem  Psychiatric Specialty Exam: General Appearance: Casual and Fairly Groomed  Eye Contact:  Good  Speech:  Clear and Coherent and Normal Rate  Volume:  Normal  Mood:  Dysphoric  Affect:  Congruent  Thought Process:  Coherent and Goal Directed  Orientation:  Full (Time, Place, and Person)  Thought Content:  WDL and Logical   Suicidal Thoughts:  No  Homicidal Thoughts:  No  Memory:  Immediate;   Good Recent;   Good  Judgement:  Good  Insight:  Good  Psychomotor Activity:  Normal  Concentration:  Concentration: Good and Attention Span: Good  Recall:  Good  Fund of Knowledge:Good  Language: Good  Akathisia:  Negative  Handed:  Right  AIMS (if indicated):  not done  Assets:  Communication Skills Desire for Improvement Housing Resilience Social Support  ADL's:  Intact  Cognition: WNL  Sleep:  Good     Diagnosis: GAD with Panic Disorder, MDD, Recurrent, Severe, w/out Psychosis, EtOH Abuse   Anticipated Frequency of Visits: every 2 weeks Anticipated Length of Treatment Episode: 20  Short Term Goals/Goals for Treatment Session: work on reframing perspective Progress Towards Goals: Initial   Treatment Intervention: Insight-oriented therapy and Supportive therapy  Medical Necessity: Assisted patient to achieve or maintain maximum functional capacity  Assessment Tools:    06/16/2022   10:15 AM 04/13/2022    9:39 AM  Depression screen PHQ 2/9  Decreased Interest 0 3  Down, Depressed, Hopeless 0 3  PHQ - 2 Score 0 6  Altered sleeping 0 3  Tired, decreased energy 2 3  Change in appetite 0 3  Feeling bad or failure about yourself  2 3  Trouble concentrating 3 3  Moving slowly or fidgety/restless 0 3  Suicidal thoughts 0 0  PHQ-9 Score 7 24  Difficult  doing work/chores Somewhat difficult    Failed to redirect to the Timeline version of the REVFS SmartLink. Flowsheet Row ED from 01/16/2023 in [facility] Emergency Department at [facility] Most recent reading at 01/16/2023  4:10 PM ED from 01/16/2023 in [facility] at [facility] Most recent reading at 01/16/2023  2:53 PM  C-SSRS RISK CATEGORY No Risk No Risk       Collaboration of Care:   Patient/Guardian was advised Release of Information must be obtained prior to any record release in order to collaborate their care with an  outside provider. Patient/Guardian was advised if they have not already done so to contact the registration department to sign all necessary forms in order for [place] to release information regarding their care.   Consent: Patient/Guardian gives verbal consent for treatment and assignment of benefits for services provided during this visit. Patient/Guardian expressed understanding and agreed to proceed.   Plan: Provided talk/supportive therapy.  Assist her with reframing her perspective and will continue to work on this.  Prior to leaving the appointment she confirmed she was in a stable and safe mindset.  She reports no SI, HI, or AVH.    Lauro Franklin, MD 02/02/2023

## 2023-02-05 ENCOUNTER — Other Ambulatory Visit (HOSPITAL_COMMUNITY): Payer: Self-pay | Admitting: Student in an Organized Health Care Education/Training Program

## 2023-02-05 ENCOUNTER — Other Ambulatory Visit: Payer: Self-pay

## 2023-02-05 DIAGNOSIS — F411 Generalized anxiety disorder: Secondary | ICD-10-CM

## 2023-02-05 DIAGNOSIS — F410 Panic disorder [episodic paroxysmal anxiety] without agoraphobia: Secondary | ICD-10-CM

## 2023-02-05 MED ORDER — HYDROXYZINE HCL 25 MG PO TABS
25.0000 mg | ORAL_TABLET | Freq: Three times a day (TID) | ORAL | 1 refills | Status: DC | PRN
Start: 2023-02-05 — End: 2023-04-27
  Filled 2023-02-05: qty 60, 20d supply, fill #0
  Filled 2023-04-04: qty 60, 20d supply, fill #1

## 2023-02-05 NOTE — Telephone Encounter (Signed)
Received message from patient's pharmacy that patient needed refill of her Hydroxyzine.  This was sent.     Sent: -Hydroxyzine 25 mg TID PRN.  60 tablets with 1 refill.    Arna Snipe MD Resident

## 2023-02-07 ENCOUNTER — Other Ambulatory Visit: Payer: Self-pay

## 2023-02-07 ENCOUNTER — Other Ambulatory Visit (HOSPITAL_COMMUNITY): Payer: Self-pay | Admitting: Student in an Organized Health Care Education/Training Program

## 2023-02-07 DIAGNOSIS — F332 Major depressive disorder, recurrent severe without psychotic features: Secondary | ICD-10-CM

## 2023-02-07 DIAGNOSIS — F411 Generalized anxiety disorder: Secondary | ICD-10-CM

## 2023-02-07 MED ORDER — VENLAFAXINE HCL ER 37.5 MG PO CP24
37.5000 mg | ORAL_CAPSULE | Freq: Every day | ORAL | 1 refills | Status: DC
Start: 2023-02-07 — End: 2023-04-11
  Filled 2023-02-07: qty 30, 30d supply, fill #0
  Filled 2023-03-15: qty 30, 30d supply, fill #1

## 2023-02-07 NOTE — Telephone Encounter (Signed)
Received message from patient's pharmacy that patient needed a refill of her Effexor.  This was sent in.  Sent: -Effexor XR 37.5 mg daily.  30 capsules with 1 refill.   Arna Snipe MD Resident

## 2023-02-23 ENCOUNTER — Encounter (HOSPITAL_COMMUNITY): Payer: Self-pay | Admitting: Student in an Organized Health Care Education/Training Program

## 2023-02-23 ENCOUNTER — Ambulatory Visit (INDEPENDENT_AMBULATORY_CARE_PROVIDER_SITE_OTHER): Payer: Medicaid Other | Admitting: Student in an Organized Health Care Education/Training Program

## 2023-02-23 DIAGNOSIS — F411 Generalized anxiety disorder: Secondary | ICD-10-CM

## 2023-02-23 DIAGNOSIS — F332 Major depressive disorder, recurrent severe without psychotic features: Secondary | ICD-10-CM

## 2023-02-23 NOTE — Progress Notes (Signed)
[facility] [address] [place] [address] Dept: [phone] Dept Fax: [phone]  Psychotherapy Progress Note  Patient ID: Sydney Mccoy, female  DOB: 1971/05/15, 52 y.o.  MRN: [id number]  02/23/2023 Start time: 10:10 AM End time: 10:49 AM  Method of Visit: Face-to-Face  Present: patient  Current Concerns:  She reports that she was able to go to the store after her last appointment and get the glasses exchanged for the correct ones.  She reports that it was not an issue like she had been thinking it would be.  She reports that her boyfriend Madelaine Bhat has been thinking of investing in a property here for a while but that due to his mother's declining health and may happen sooner than later.  She reports still not knowing how to feel about that.  She reports continuing to have conflict with her mother.  She reports she thinks that her mother feels like it is her trying to control her mother and she reports it feels like her mom is wanting to control her.  She reports he feels like family therapy is needed but does not think her mother would ever go with this.  She reports that communication has been such an issue and reports that an example of this that she has not done a puzzle in over a month due to her mother making fun of her for it.  She reports frequently her mother "takes the wind out of her sales."  She reports that she has been thinking more about getting a job.  She reports that she thinks specifically that [employer] may be a good idea for her.  She reports that her love of the fall and the season has always been a significant thing for her.  She reports she could see that this would make it a lot easier for her to manage his job.  Encouraged her to research this further and she reports she will.  Prior to leaving the appointment she confirmed she was in a stable and safe mindset.  She reports no SI, HI, or  AVH.   Current Symptoms: Anxiety, Family Stress, and Other: work  Psychiatric Specialty Exam: General Appearance: Casual and Fairly Groomed  Eye Contact:  Good  Speech:  Clear and Coherent and Normal Rate  Volume:  Normal  Mood:   "ok"  Affect:  Appropriate and Congruent  Thought Process:  Coherent and Goal Directed  Orientation:  Full (Time, Place, and Person)  Thought Content:  WDL and Logical  Suicidal Thoughts:  No  Homicidal Thoughts:  No  Memory:  Immediate;   Good Recent;   Good  Judgement:  Good  Insight:  Good  Psychomotor Activity:  Normal  Concentration:  Concentration: Good and Attention Span: Good  Recall:  Good  Fund of Knowledge:Good  Language: Good  Akathisia:  Negative  Handed:  Right  AIMS (if indicated):  not done  Assets:  Communication Skills Desire for Improvement Housing Resilience Social Support  ADL's:  Intact  Cognition: WNL  Sleep:  Good     Diagnosis: GAD with Panic Disorder, MDD, Recurrent, Severe, w/out Psychosis, EtOH Abuse   Anticipated Frequency of Visits: every 2 weeks Anticipated Length of Treatment Episode: 20  Short Term Goals/Goals for Treatment Session: Research/apply for [employer] job. Progress Towards Goals: Initial   Treatment Intervention: Insight-oriented therapy and Supportive therapy  Medical Necessity: Assisted patient to achieve or maintain maximum functional capacity  Assessment Tools:  06/16/2022   10:15 AM 04/13/2022    9:39 AM  Depression screen PHQ 2/9  Decreased Interest 0 3  Down, Depressed, Hopeless 0 3  PHQ - 2 Score 0 6  Altered sleeping 0 3  Tired, decreased energy 2 3  Change in appetite 0 3  Feeling bad or failure about yourself  2 3  Trouble concentrating 3 3  Moving slowly or fidgety/restless 0 3  Suicidal thoughts 0 0  PHQ-9 Score 7 24  Difficult doing work/chores Somewhat difficult    Failed to redirect to the Timeline version of the REVFS SmartLink. Flowsheet Row ED from  01/16/2023 in [facility] Emergency Department at [facility] Most recent reading at 01/16/2023  4:10 PM ED from 01/16/2023 in [facility] Urgent Care at [facility] Most recent reading at 01/16/2023  2:53 PM  C-SSRS RISK CATEGORY No Risk No Risk       Collaboration of Care:   Patient/Guardian was advised Release of Information must be obtained prior to any record release in order to collaborate their care with an outside provider. Patient/Guardian was advised if they have not already done so to contact the registration department to sign all necessary forms in order for [place] to release information regarding their care.   Consent: Patient/Guardian gives verbal consent for treatment and assignment of benefits for services provided during this visit. Patient/Guardian expressed understanding and agreed to proceed.   Plan: Provided talk/supportive therapy.  She was able to reframe the situation with her glasses and was able to get the correct glasses.  She has been thinking of applying to work at [employer].  She will research the application and consider applying.  Prior to leaving the appointment she confirmed she was in a stable and safe mindset.  She reports no SI, HI, or AVH.\    Lauro Franklin, MD 02/23/2023

## 2023-03-09 ENCOUNTER — Ambulatory Visit (INDEPENDENT_AMBULATORY_CARE_PROVIDER_SITE_OTHER): Payer: Medicaid Other | Admitting: Student in an Organized Health Care Education/Training Program

## 2023-03-09 DIAGNOSIS — F332 Major depressive disorder, recurrent severe without psychotic features: Secondary | ICD-10-CM | POA: Diagnosis not present

## 2023-03-09 DIAGNOSIS — F411 Generalized anxiety disorder: Secondary | ICD-10-CM | POA: Diagnosis not present

## 2023-03-09 DIAGNOSIS — F1010 Alcohol abuse, uncomplicated: Secondary | ICD-10-CM | POA: Diagnosis not present

## 2023-03-09 NOTE — Progress Notes (Signed)
BE[facility]ALTH [facility]OS[facility] [address] [place] [address] Dept: [phone] Dept Fax: [phone]  Psychotherapy Progress Note  Patient ID: Sydney Mccoy, female  DOB: October 10, 1970, 52 y.o.  MRN: [id number]  03/09/2023 Start time: 9:34 AM End time: 10:21 AM  Method of Visit: Face-to-Face  Present: patient  Current Concerns:  She reports that she is under significant stress due to her boyfriend Adams family.  She reports that yesterday he called her and told her that his father had perforated his bowels and was in the hospital.  She reports that this caused significant stressors for her because of her experience with her bowel perforation.  She reports that she knows this will be a very arduous situation for him seeing as how she was 3 when hers happened and he is 36.  She also reports that [name]'s mother is having significant memory issues and so further complicating situation.  She reports that due to this news she drank half a pint of vodka last night.  She reports she knows this was a bad decision for her and she does not plan to continue continue drinking because she knows it worsens her depression and anxiety.  She reports that she knows she needs to make changes because while she is better she still has so far to go.  Discussed taking large tasks and breaking them down into small quantifiable portions.  Suggested to her that the first change she should make is looking in the mirror since she reports she never does this as this will help start to break her out of her routine pattern.  She reports that she did not apply to [facility] [employer] but does plan to do so once the acute situation with Madelaine Bhat and his family is resolved.  Prior to leaving the appointment she confirmed she was in a stable and safe mindset.  She reports no SI, HI, or AVH.    Current Symptoms: Anxiety, Family Stress, and Other: work, family health  Psychiatric Specialty  Exam: General Appearance: Casual and Fairly Groomed  Eye Contact:  Good  Speech:  Clear and Coherent and Normal Rate  Volume:  Normal  Mood:  Anxious  Affect:  Congruent  Thought Process:  Coherent and Goal Directed  Orientation:  Full (Time, Place, and Person)  Thought Content:  WDL and Logical  Suicidal Thoughts:  No  Homicidal Thoughts:  No  Memory:  Immediate;   Good Recent;   Good  Judgement:  Good  Insight:  Good  Psychomotor Activity:  Normal  Concentration:  Concentration: Good and Attention Span: Good  Recall:  Good  Fund of Knowledge:Good  Language: Good  Akathisia:  Negative  Handed:  Right  AIMS (if indicated):  not done  Assets:  Communication Skills Desire for Improvement Housing Resilience Social Support  ADL's:  Intact  Cognition: WNL  Sleep:  Good     Diagnosis: GAD with Panic Disorder, MDD, Recurrent, Severe, w/out Psychosis, EtOH Abuse   Anticipated Frequency of Visits: every 2 weeks Anticipated Length of Treatment Episode: 20  Short Term Goals/Goals for Treatment Session: Research/apply for [employer] job. Look into the mirror at least once a day. Progress Towards Goals: Initial   Treatment Intervention: Insight-oriented therapy and Supportive therapy  Medical Necessity: Assisted patient to achieve or maintain maximum functional capacity  Assessment Tools:    02/23/2023   10:51 AM 06/16/2022   10:15 AM 04/13/2022    9:39 AM  Depression screen PHQ 2/9  Decreased Interest 2 0 3  Down, Depressed, Hopeless 1 0 3  PHQ - 2 Score 3 0 6  Altered sleeping 0 0 3  Tired, decreased energy 2 2 3   Change in appetite 3 0 3  Feeling bad or failure about yourself  2 2 3   Trouble concentrating 0 3 3  Moving slowly or fidgety/restless 0 0 3  Suicidal thoughts 0 0 0  PHQ-9 Score 10 7 24   Difficult doing work/chores  Somewhat difficult    Failed to redirect to the Timeline version of the REVFS SmartLink. Flowsheet Row ED from 01/16/2023 in [facility] Emergency Department at [facility] Most recent reading at 01/16/2023  4:10 PM ED from 01/16/2023 in [facility] Urgent Care at [facility] Most recent reading at 01/16/2023  2:53 PM  C-SSRS RISK CATEGORY No Risk No Risk       Collaboration of Care:   Patient/Guardian was advised Release of Information must be obtained prior to any record release in order to collaborate their care with an outside provider. Patient/Guardian was advised if they have not already done so to contact the registration department to sign all necessary forms in order for [place] to release information regarding their care.   Consent: Patient/Guardian gives verbal consent for treatment and assignment of benefits for services provided during this visit. Patient/Guardian expressed understanding and agreed to proceed.   Plan: Provided talk/supportive therapy.  She will begin changing her habits by looking into the mirror at least daily going forward.  Once the situation with Madelaine Bhat and his family settles down she will look into applying for [employer].  She will also reestablish her sobriety.  Prior to leaving the appointment she confirmed she was in a stable and safe mindset.  She reports no SI, HI, or AVH.     Lauro Franklin, MD 03/09/2023

## 2023-03-15 ENCOUNTER — Other Ambulatory Visit: Payer: Self-pay

## 2023-03-15 ENCOUNTER — Other Ambulatory Visit (HOSPITAL_COMMUNITY): Payer: Self-pay | Admitting: Student in an Organized Health Care Education/Training Program

## 2023-03-15 DIAGNOSIS — F410 Panic disorder [episodic paroxysmal anxiety] without agoraphobia: Secondary | ICD-10-CM

## 2023-03-15 DIAGNOSIS — F332 Major depressive disorder, recurrent severe without psychotic features: Secondary | ICD-10-CM

## 2023-03-15 DIAGNOSIS — F411 Generalized anxiety disorder: Secondary | ICD-10-CM

## 2023-03-15 MED ORDER — PROPRANOLOL HCL 10 MG PO TABS
10.0000 mg | ORAL_TABLET | Freq: Three times a day (TID) | ORAL | 1 refills | Status: DC
Start: 2023-03-15 — End: 2023-04-27
  Filled 2023-03-15: qty 90, 30d supply, fill #0
  Filled 2023-04-11: qty 90, 30d supply, fill #1

## 2023-03-15 MED ORDER — MIRTAZAPINE 15 MG PO TABS
15.0000 mg | ORAL_TABLET | Freq: Every day | ORAL | 1 refills | Status: DC
Start: 2023-03-15 — End: 2023-04-27
  Filled 2023-03-15: qty 30, 30d supply, fill #0
  Filled 2023-04-11: qty 30, 30d supply, fill #1

## 2023-03-15 NOTE — Telephone Encounter (Signed)
Received message from patient's pharmacy that she needed refills of her Remeron and propranolol.  These were sent in.   Sent: -Remeron 15 mg QHS.  30 tablets with 1 refill. -Propanolol 10 mg TID.  90 tablets with 1 refill.    Arna Snipe MD Resident

## 2023-03-19 ENCOUNTER — Other Ambulatory Visit: Payer: Self-pay

## 2023-03-30 ENCOUNTER — Ambulatory Visit (INDEPENDENT_AMBULATORY_CARE_PROVIDER_SITE_OTHER): Payer: Medicaid Other | Admitting: Student in an Organized Health Care Education/Training Program

## 2023-03-30 ENCOUNTER — Encounter (HOSPITAL_COMMUNITY): Payer: Self-pay | Admitting: Student in an Organized Health Care Education/Training Program

## 2023-03-30 DIAGNOSIS — F332 Major depressive disorder, recurrent severe without psychotic features: Secondary | ICD-10-CM | POA: Diagnosis not present

## 2023-03-30 DIAGNOSIS — F411 Generalized anxiety disorder: Secondary | ICD-10-CM | POA: Diagnosis not present

## 2023-03-30 DIAGNOSIS — F410 Panic disorder [episodic paroxysmal anxiety] without agoraphobia: Secondary | ICD-10-CM

## 2023-03-30 MED ORDER — CLONAZEPAM 1 MG PO TABS
1.0000 mg | ORAL_TABLET | Freq: Two times a day (BID) | ORAL | 0 refills | Status: DC | PRN
Start: 2023-03-30 — End: 2023-10-08

## 2023-03-30 NOTE — Progress Notes (Signed)
[facility] [address] [place] [address] Dept: [phone] Dept Fax: [phone]  Psychotherapy Progress Note  Patient ID: Sydney Mccoy, female  DOB: [birth year], 52 y.o.  MRN: [id number]  03/30/2023 Start time: 11:05 AM End time: 11:51 AM  Method of Visit: Face-to-Face  Present: patient  Current Concerns:  She reports that she is not doing so well today.  She reports that the day after her last appointment [name]'s mother, Erskine Squibb, was hospitalized.  She reports that by 06/08/2023 she had passed away.  She reports that this has been a tough time but there is a significant stressor that is coming up which is causing her anxiety presently.  She reports that due to the funeral this will be the first time she will meet [name]'s ex-wife Pam face-to-face.  She reports that since it has been over 8 years it already had been too long for this meeting to happen and that now it is at the funeral it is significantly worse.  She reports she has been having a constant and significant spike in her anxiety due to the thoughts of having a panic attack at the funeral.  She reports it is very overwhelming.  Discussed with her that given the significant amount of stress and anxiety and a specific and definable event that can trigger that panic attack there is a a class of medication we could use in this circumstance.  Discussed with her that if we were to use this medication it would only be for this 1 time in this specific situation and that there would be no refills.  She reported understanding and was in agreement with this.  Discussed a prescription for Klonopin would be given.  Discussed with her that she would be prescribed only 2 tablets.  Instructed her to take 1 approximately 30 minutes prior to the funeral and then only use the other one if needed.  She reported understanding and was agreeable with this.  Discussed with her that once the funeral  was over we could resume with her tasks of applying for jobs and looking in the mirror daily.  Prior to leaving the appointment she confirmed she was in a stable and safe mindset.  She reports no SI, HI, or AVH.     Current Symptoms: Anxiety and Family Stress  Psychiatric Specialty Exam: General Appearance: Casual and Fairly Groomed  Eye Contact:  Good  Speech:  Clear and Coherent and Normal Rate  Volume:  Normal  Mood:  Anxious  Affect:  Congruent  Thought Process:  Coherent and Goal Directed  Orientation:  Full (Time, Place, and Person)  Thought Content:  WDL and Logical  Suicidal Thoughts:  No  Homicidal Thoughts:  No  Memory:  Immediate;   Good Recent;   Good  Judgement:  Good  Insight:  Good  Psychomotor Activity:  Normal  Concentration:  Concentration: Good and Attention Span: Good  Recall:  Good  Fund of Knowledge:Good  Language: Good  Akathisia:  Negative  Handed:  Right  AIMS (if indicated):  not done  Assets:  Communication Skills Desire for Improvement Housing Resilience Social Support  ADL's:  Intact  Cognition: WNL  Sleep:  Good     Diagnosis: GAD with Panic Disorder, MDD, Recurrent, Severe, w/out Psychosis, EtOH Abuse   Anticipated Frequency of Visits: every 2 weeks Anticipated Length of Treatment Episode: 20  Short Term Goals/Goals for Treatment Session: Research/apply for [employer] job. Look into the mirror  at least once a day. Progress Towards Goals: Revised Due to death in the family and upcoming funeral     Treatment Intervention: Insight-oriented therapy and Supportive therapy  Medical Necessity: Assisted patient to achieve or maintain maximum functional capacity  Assessment Tools:    02/23/2023   10:51 AM 06/16/2022   10:15 AM 04/13/2022    9:39 AM  Depression screen PHQ 2/9  Decreased Interest 2 0 3  Down, Depressed, Hopeless 1 0 3  PHQ - 2 Score 3 0 6  Altered sleeping 0 0 3  Tired, decreased energy 2 2 3   Change in  appetite 3 0 3  Feeling bad or failure about yourself  2 2 3   Trouble concentrating 0 3 3  Moving slowly or fidgety/restless 0 0 3  Suicidal thoughts 0 0 0  PHQ-9 Score 10 7 24   Difficult doing work/chores  Somewhat difficult    Failed to redirect to the Timeline version of the REVFS SmartLink. Flowsheet Row ED from 01/16/2023 in [facility] Emergency Department at [facility] Most recent reading at 01/16/2023  4:10 PM ED from 01/16/2023 in [facility] Care at [facility] Most recent reading at 01/16/2023  2:53 PM  C-SSRS RISK CATEGORY No Risk No Risk       Collaboration of Care:   Patient/Guardian was advised Release of Information must be obtained prior to any record release in order to collaborate their care with an outside provider. Patient/Guardian was advised if they have not already done so to contact the registration department to sign all necessary forms in order for [place] to release information regarding their care.   Consent: Patient/Guardian gives verbal consent for treatment and assignment of benefits for services provided during this visit. Patient/Guardian expressed understanding and agreed to proceed.   Plan: Provided talk/supportive therapy.  She is under considerable stress/anxiety due to the passing of [name]'s mother.  Due to the specific, definable timeframe of a significant stressor the funeral and the meeting of [name]'s ex-wife Pam for the first time we will prescribe Klonopin to address.  This will be a one-time prescription for 2 tablets and it has been discussed with her that this will not be filled again.  We will not make any other changes to her medications at this time.  After the funeral she will resume working on looking into [employer] and applying for work.  Prior to leaving the appointment she confirmed she was in a stable and safe mindset.  She reports no SI, HI, or AVH.    Panic Disorder: -Klonopin 1 mg BID PRN.  2 tablets with 0  refills.   Lauro Franklin, MD 03/30/2023

## 2023-04-04 ENCOUNTER — Other Ambulatory Visit: Payer: Self-pay

## 2023-04-10 ENCOUNTER — Ambulatory Visit: Payer: Medicaid Other | Admitting: Gastroenterology

## 2023-04-10 ENCOUNTER — Ambulatory Visit: Payer: Medicaid Other | Admitting: Physician Assistant

## 2023-04-11 ENCOUNTER — Other Ambulatory Visit (HOSPITAL_COMMUNITY): Payer: Self-pay | Admitting: Student in an Organized Health Care Education/Training Program

## 2023-04-11 ENCOUNTER — Other Ambulatory Visit: Payer: Self-pay

## 2023-04-11 DIAGNOSIS — F332 Major depressive disorder, recurrent severe without psychotic features: Secondary | ICD-10-CM

## 2023-04-11 DIAGNOSIS — F411 Generalized anxiety disorder: Secondary | ICD-10-CM

## 2023-04-13 ENCOUNTER — Other Ambulatory Visit: Payer: Self-pay

## 2023-04-13 ENCOUNTER — Ambulatory Visit (HOSPITAL_COMMUNITY): Payer: Medicaid Other | Admitting: Student in an Organized Health Care Education/Training Program

## 2023-04-13 MED ORDER — VENLAFAXINE HCL ER 37.5 MG PO CP24
37.5000 mg | ORAL_CAPSULE | Freq: Every day | ORAL | 1 refills | Status: DC
Start: 2023-04-13 — End: 2023-04-27
  Filled 2023-04-13: qty 30, 30d supply, fill #0

## 2023-04-13 NOTE — Telephone Encounter (Signed)
Received message from patient's pharmacy that she needed a refill of her Effexor.  This was sent in.   Sent: -Effexor XR 37.5 mg daily.  30 capsules with 1 refill.    Arna Snipe MD Resident

## 2023-04-27 ENCOUNTER — Encounter (HOSPITAL_COMMUNITY): Payer: Self-pay | Admitting: Student in an Organized Health Care Education/Training Program

## 2023-04-27 ENCOUNTER — Other Ambulatory Visit: Payer: Self-pay

## 2023-04-27 ENCOUNTER — Ambulatory Visit (INDEPENDENT_AMBULATORY_CARE_PROVIDER_SITE_OTHER): Payer: Medicaid Other | Admitting: Student in an Organized Health Care Education/Training Program

## 2023-04-27 DIAGNOSIS — F410 Panic disorder [episodic paroxysmal anxiety] without agoraphobia: Secondary | ICD-10-CM

## 2023-04-27 DIAGNOSIS — F332 Major depressive disorder, recurrent severe without psychotic features: Secondary | ICD-10-CM | POA: Diagnosis not present

## 2023-04-27 DIAGNOSIS — F411 Generalized anxiety disorder: Secondary | ICD-10-CM

## 2023-04-27 MED ORDER — HYDROXYZINE HCL 25 MG PO TABS
25.0000 mg | ORAL_TABLET | Freq: Three times a day (TID) | ORAL | 1 refills | Status: DC | PRN
  Filled 2023-04-27: qty 60, 20d supply, fill #0

## 2023-04-27 MED ORDER — MIRTAZAPINE 15 MG PO TABS
15.0000 mg | ORAL_TABLET | Freq: Every day | ORAL | 1 refills | Status: DC
Start: 2023-04-27 — End: 2023-07-14
  Filled 2023-04-27 – 2023-05-17 (×2): qty 30, 30d supply, fill #0
  Filled 2023-06-12: qty 30, 30d supply, fill #1

## 2023-04-27 MED ORDER — PROPRANOLOL HCL 10 MG PO TABS
10.0000 mg | ORAL_TABLET | Freq: Three times a day (TID) | ORAL | 1 refills | Status: DC
Start: 2023-04-27 — End: 2023-07-06
  Filled 2023-04-27 – 2023-05-17 (×2): qty 90, 30d supply, fill #0
  Filled 2023-06-12: qty 90, 30d supply, fill #1

## 2023-04-27 MED ORDER — VENLAFAXINE HCL ER 75 MG PO CP24
75.0000 mg | ORAL_CAPSULE | Freq: Every day | ORAL | 1 refills | Status: DC
Start: 2023-04-27 — End: 2023-06-03
  Filled 2023-04-27: qty 30, 30d supply, fill #0
  Filled 2023-05-22: qty 30, 30d supply, fill #1

## 2023-04-27 NOTE — Progress Notes (Signed)
[facility] [address] [place] [address] Dept: [phone] Dept Fax: [phone]  Psychotherapy Progress Note  Patient ID: Sydney Mccoy, female  DOB: 06-16-1971, 52 y.o.  MRN: [id number]  04/27/2023 Start time: 8:07 AM End time: 8:47 AM  Method of Visit: Face-to-Face  Present: patient  Current Concerns:  She reports that she did manage the funeral relatively well.  She reports that there was some friction prior to the funeral with [facility] but that it was mostly fine.  She reports she did meet Pam at the family greeting afterwards and while she was cold towards her it went all right.  She reports that she has been looking in the mirror more and that it has gone all right.  She reports that it tends to be extremes in her response either good or bad but no middle ground.  Encouraged her to continue focusing on the positive responses so she can build the habit of having positive responses to her self.  She reports she has been noticing improvements in this and herself overall.  She reports in the past she was anxious about going out and now finds she is anxious about staying in the house.  She reports having anxiety over the thought of having another panic attack.  Discussed increasing her Effexor to address her anxiety and she was agreeable with this.  Discussed we would not make any other change her medication at this time.  She reports no other concerns at present.  Prior to leaving the appointment she confirmed she was in a stable and safe mindset.  She reports no SI, HI, or AVH.   Current Symptoms: Anxiety and Family Stress  Psychiatric Specialty Exam: General Appearance: Casual and Fairly Groomed  Eye Contact:  Good  Speech:  Clear and Coherent and Normal Rate  Volume:  Normal  Mood:  Anxious, mild  Affect:  Congruent  Thought Process:  Coherent and Goal Directed  Orientation:  Full (Time, Place, and Person)   Thought Content:  WDL and Logical  Suicidal Thoughts:  No  Homicidal Thoughts:  No  Memory:  Immediate;   Good Recent;   Good  Judgement:  Good  Insight:  Good  Psychomotor Activity:  Normal  Concentration:  Concentration: Good and Attention Span: Good  Recall:  Good  Fund of Knowledge:Good  Language: Good  Akathisia:  Negative  Handed:  Right  AIMS (if indicated):  not done  Assets:  Communication Skills Desire for Improvement Housing Resilience Social Support  ADL's:  Intact  Cognition: WNL  Sleep:  Good     Diagnosis: GAD with Panic Disorder, MDD, Recurrent, Severe, w/out Psychosis, EtOH Abuse   Anticipated Frequency of Visits: every 2 weeks Anticipated Length of Treatment Episode: 20  Short Term Goals/Goals for Treatment Session: Continue looking into the mirror at least once a day.  Progress Towards Goals: Progressing   Treatment Intervention: Insight-oriented therapy and Supportive therapy  Medical Necessity: Assisted patient to achieve or maintain maximum functional capacity  Assessment Tools:    02/23/2023   10:51 AM 06/16/2022   10:15 AM 04/13/2022    9:39 AM  Depression screen PHQ 2/9  Decreased Interest 2 0 3  Down, Depressed, Hopeless 1 0 3  PHQ - 2 Score 3 0 6  Altered sleeping 0 0 3  Tired, decreased energy 2 2 3   Change in appetite 3 0 3  Feeling bad or failure about yourself  2 2 3  Trouble concentrating 0 3 3  Moving slowly or fidgety/restless 0 0 3  Suicidal thoughts 0 0 0  PHQ-9 Score 10 7 24   Difficult doing work/chores  Somewhat difficult    Failed to redirect to the Timeline version of the REVFS SmartLink. Flowsheet Row ED from 01/16/2023 in [facility] Emergency Department at [facility] Most recent reading at 01/16/2023  4:10 PM ED from 01/16/2023 in [facility] Urgent Care at [facility] Most recent reading at 01/16/2023  2:53 PM  C-SSRS RISK CATEGORY No Risk No Risk       Collaboration of Care:   Patient/Guardian was advised  Release of Information must be obtained prior to any record release in order to collaborate their care with an outside provider. Patient/Guardian was advised if they have not already done so to contact the registration department to sign all necessary forms in order for [place] to release information regarding their care.   Consent: Patient/Guardian gives verbal consent for treatment and assignment of benefits for services provided during this visit. Patient/Guardian expressed understanding and agreed to proceed.   Plan: Provided talk/supportive therapy.  She was able to navigate the funeral without significant issue.  She does continue to have significant anxiety so we will increase her Effexor.  We will not make any other changes to her medications at this time.  She will continue working on looking in [employer] and focusing on positive emotions.  She will work on continuing to increase her time outside the house.  Prior to leaving the appointment she confirmed she was in a stable and safe mindset.  She reports no SI, HI, or AVH.    GAD  MDD:  -Increase Effexor XR to 75 mg daily for depression and anxiety.  30 capsules with 1 refill. -Continue Remeron 15 mg QHS for depression, andxiety, sleep, and appetite.  30 tablets with 1 refill. -Continue Propanolol 10 mg TID for anxiety and HTN.  90 tablets with 1 refill. -Continue Hydroxyzine 25 mg TID PRN for anxiety.  60 tablets with 1 refill.   Lauro Franklin, MD 04/27/2023

## 2023-05-11 ENCOUNTER — Ambulatory Visit (INDEPENDENT_AMBULATORY_CARE_PROVIDER_SITE_OTHER): Payer: Medicaid Other | Admitting: Student in an Organized Health Care Education/Training Program

## 2023-05-11 ENCOUNTER — Encounter (HOSPITAL_COMMUNITY): Payer: Self-pay | Admitting: Student in an Organized Health Care Education/Training Program

## 2023-05-11 DIAGNOSIS — F332 Major depressive disorder, recurrent severe without psychotic features: Secondary | ICD-10-CM | POA: Diagnosis not present

## 2023-05-11 DIAGNOSIS — F411 Generalized anxiety disorder: Secondary | ICD-10-CM

## 2023-05-11 NOTE — Progress Notes (Signed)
[facility] [address] [place] [address] Dept: [phone] Dept Fax: [phone]  Psychotherapy Progress Note  Patient ID: Sydney Mccoy, female  DOB: Jan 14, 1971, 52 y.o.  MRN: [id number]  05/11/2023 Start time: 9:10 AM End time: 9:50 AM  Method of Visit: Face-to-Face  Present: patient  Current Concerns:  She reports she has been doing relatively well since her last appointment.  She reports that Madelaine Bhat did come up for his father's birthday and that she did have a conversation with him that they have been together for about 10 years and she feels like it is time for more of a commitment.  She reports she told him she is not ready to go get married right now because she still needs to work on herself but she needs him to have a ring.  She reports that this discussion seemed to go well.  She reports that another thing she was somewhat dreading did happen.  She reports that Pernell Dupre family will be having Thanksgiving in [place].  She reports that she is fine with this as she did expect it given [name]'s father may not want to have Thanksgiving at the house due to the recent loss of his wife.  She reports that [name]'s sister also has a new boyfriend in [place] and so it makes sense that they would all go there.  She reports what was hurtful was they did not even ask her if she wanted to come to [place] but instead asked her to watch the dog.  She reports she would most likely not have gone and stayed in [facility] but at the fact that they did not ask and went straight to asking her to watch the dog which is kind of hurtful.  She reports she is trying to look at this in a mature way and provided encouragement and support that she was.    She reports she will be applying for a job most likely at [employer] after Christmas.  Discussed with her that this would be ideal for her as January will be much less stressful than  November/December.  Discussed with her the importance of giving herself credit for the work she is putting and her ability to look at a situation from outside her own perspective as well as the growth she has shown last year.  She reports she is starting to see it but that this is difficult.  Prior to leaving the appointment she confirmed she was in a stable and safe mindset.  She reports no SI, HI, or AVH.    Current Symptoms: Anxiety and Family Stress  Psychiatric Specialty Exam: General Appearance: Casual and Fairly Groomed  Eye Contact:  Good  Speech:  Clear and Coherent and Normal Rate  Volume:  Normal  Mood:  Euthymic  Affect:  Appropriate and Congruent  Thought Process:  Coherent and Goal Directed  Orientation:  Full (Time, Place, and Person)  Thought Content:  WDL and Logical  Suicidal Thoughts:  No  Homicidal Thoughts:  No  Memory:  Immediate;   Good Recent;   Good  Judgement:  Good  Insight:  Good  Psychomotor Activity:  Normal  Concentration:  Concentration: Good and Attention Span: Good  Recall:  Good  Fund of Knowledge:Good  Language: Good  Akathisia:  Negative  Handed:  Right  AIMS (if indicated):  not done  Assets:  Communication Skills Desire for Improvement Housing Resilience Social Support  ADL's:  Intact  Cognition:  WNL  Sleep:  Good     Diagnosis: GAD with Panic Disorder, MDD, Recurrent, Severe, w/out Psychosis, EtOH Abuse   Anticipated Frequency of Visits: every 2 weeks Anticipated Length of Treatment Episode: 20  Short Term Goals/Goals for Treatment Session: Give herself credit for her progress Progress Towards Goals: Initial   Treatment Intervention: Insight-oriented therapy and Supportive therapy  Medical Necessity: Assisted patient to achieve or maintain maximum functional capacity  Assessment Tools:    02/23/2023   10:51 AM 06/16/2022   10:15 AM 04/13/2022    9:39 AM  Depression screen PHQ 2/9  Decreased Interest 2 0 3  Down,  Depressed, Hopeless 1 0 3  PHQ - 2 Score 3 0 6  Altered sleeping 0 0 3  Tired, decreased energy 2 2 3   Change in appetite 3 0 3  Feeling bad or failure about yourself  2 2 3   Trouble concentrating 0 3 3  Moving slowly or fidgety/restless 0 0 3  Suicidal thoughts 0 0 0  PHQ-9 Score 10 7 24   Difficult doing work/chores  Somewhat difficult    Failed to redirect to the Timeline version of the REVFS SmartLink. Flowsheet Row ED from 01/16/2023 in [facility] Emergency Department at [facility] Most recent reading at 01/16/2023  4:10 PM ED from 01/16/2023 in [facility] Urgent Care at [facility] Most recent reading at 01/16/2023  2:53 PM  C-SSRS RISK CATEGORY No Risk No Risk       Collaboration of Care:   Patient/Guardian was advised Release of Information must be obtained prior to any record release in order to collaborate their care with an outside provider. Patient/Guardian was advised if they have not already done so to contact the registration department to sign all necessary forms in order for [place] to release information regarding their care.   Consent: Patient/Guardian gives verbal consent for treatment and assignment of benefits for services provided during this visit. Patient/Guardian expressed understanding and agreed to proceed.   Plan: Provided talk/supportive therapy.  She has been showing more mature coping mechanisms.  She will work on giving herself credit for the progress she has made.  She is planning on applying for a job after the holiday season.  Prior to leaving the appointment she confirmed she was in a stable and safe mindset.  She reports no SI, HI, or AVH.     GAD  MDD:  -Continue Effexor XR 75 mg daily for depression and anxiety.  No refills sent at this time. -Continue Remeron 15 mg QHS for depression, andxiety, sleep, and appetite.  No refills sent at this time. -Continue Propanolol 10 mg TID for anxiety and HTN.  No refills sent at this time. -Continue  Hydroxyzine 25 mg TID PRN for anxiety.  No refills sent at this time.   Lauro Franklin, MD 05/11/2023

## 2023-05-17 ENCOUNTER — Other Ambulatory Visit: Payer: Self-pay

## 2023-05-18 ENCOUNTER — Other Ambulatory Visit: Payer: Self-pay

## 2023-05-23 ENCOUNTER — Other Ambulatory Visit: Payer: Self-pay

## 2023-06-01 ENCOUNTER — Encounter (HOSPITAL_COMMUNITY): Payer: Self-pay | Admitting: Student in an Organized Health Care Education/Training Program

## 2023-06-01 ENCOUNTER — Ambulatory Visit (INDEPENDENT_AMBULATORY_CARE_PROVIDER_SITE_OTHER): Payer: Medicaid Other | Admitting: Student in an Organized Health Care Education/Training Program

## 2023-06-01 DIAGNOSIS — F410 Panic disorder [episodic paroxysmal anxiety] without agoraphobia: Secondary | ICD-10-CM

## 2023-06-01 DIAGNOSIS — F1010 Alcohol abuse, uncomplicated: Secondary | ICD-10-CM | POA: Diagnosis not present

## 2023-06-01 DIAGNOSIS — F332 Major depressive disorder, recurrent severe without psychotic features: Secondary | ICD-10-CM

## 2023-06-01 DIAGNOSIS — F411 Generalized anxiety disorder: Secondary | ICD-10-CM

## 2023-06-01 NOTE — Progress Notes (Signed)
[facility] [address] [place] [address] Dept: [phone] Dept Fax: [phone]  Psychotherapy Progress Note  Patient ID: Sydney Mccoy, female  DOB: 1970-08-22, 52 y.o.  MRN: [id number]  06/01/2023 Start time: 8:34 AM End time: 9:18 AM  Method of Visit: Face-to-Face  Present: patient  Current Concerns:  She reports she does not know what to talk about today.  She reports that this morning while she was thinking of what to talk about today she realized she has been doing really well since her last appointment.  She reports that she realized she has been feeling better.  She reports that after thinking about it she realized that she has not had any fear of fear.  Discussed how people can get acclimatized to stressors and end up not noticing.  She reports it is like her COPD that she has left untreated for so long.  Encouraged her to follow up with her PCP to address this.  She reports that her liver has been doing better and her drinking has improved.  She reports currently she is only drinking 4 beers Friday and Saturday night and then does not drink the rest the week.  Encouraged her to continue cutting back as this amount could still be an issue.  She reports that she has not seen her GI yet as the appointment was when [name]'s mother went to the hospital but that it has been rescheduled for the beginning of January.  She reports that there is some anxiety about next week as she and her son will be cleaning out the storage unit they have had for the last 10+ years.  She reports she knows a lot of it has probably molded since it was not climate controlled and that they are planning to throw out most of the things but hope to save some things.  Discussed meditation and being present in the moment.  She reports she has had success with self soothing using square breathing.  Encouraged her to focus on her decreased anxiety.  She  reports she still plans to apply for a job in January.  Prior to leaving the appointment she confirmed she was in a stable and safe mindset.  She reports no SI, HI, or AVH.   Current Symptoms: Family Stress  Psychiatric Specialty Exam: General Appearance: Casual and Fairly Groomed  Eye Contact:  Good  Speech:  Clear and Coherent and Normal Rate  Volume:  Normal  Mood:  Euthymic  Affect:  Appropriate and Congruent  Thought Process:  Coherent and Goal Directed  Orientation:  Full (Time, Place, and Person)  Thought Content:  WDL and Logical  Suicidal Thoughts:  No  Homicidal Thoughts:  No  Memory:  Immediate;   Good Recent;   Good  Judgement:  Good  Insight:  Good  Psychomotor Activity:  Normal  Concentration:  Concentration: Good and Attention Span: Good  Recall:  Good  Fund of Knowledge:Good  Language: Good  Akathisia:  Negative  Handed:  Right  AIMS (if indicated):  not done  Assets:  Communication Skills Desire for Improvement Housing Resilience Social Support  ADL's:  Intact  Cognition: WNL  Sleep:  Good     Diagnosis: GAD with Panic Disorder, MDD, Recurrent, Severe, w/out Psychosis, EtOH Abuse   Anticipated Frequency of Visits: every 2 weeks Anticipated Length of Treatment Episode: 20  Short Term Goals/Goals for Treatment Session: Focus on being present in the moment and acknowledging recent  tranquility Progress Towards Goals: Initial   Treatment Intervention: Insight-oriented therapy and Supportive therapy  Medical Necessity: Assisted patient to achieve or maintain maximum functional capacity  Assessment Tools:    02/23/2023   10:51 AM 06/16/2022   10:15 AM 04/13/2022    9:39 AM  Depression screen PHQ 2/9  Decreased Interest 2 0 3  Down, Depressed, Hopeless 1 0 3  PHQ - 2 Score 3 0 6  Altered sleeping 0 0 3  Tired, decreased energy 2 2 3   Change in appetite 3 0 3  Feeling bad or failure about yourself  2 2 3   Trouble concentrating 0 3 3  Moving  slowly or fidgety/restless 0 0 3  Suicidal thoughts 0 0 0  PHQ-9 Score 10 7 24   Difficult doing work/chores  Somewhat difficult    Failed to redirect to the Timeline version of the REVFS SmartLink. Flowsheet Row ED from 01/16/2023 in [facility] Emergency Department at [facility] Most recent reading at 01/16/2023  4:10 PM ED from 01/16/2023 in [facility] Urgent Care at [facility] Most recent reading at 01/16/2023  2:53 PM  C-SSRS RISK CATEGORY No Risk No Risk       Collaboration of Care:   Patient/Guardian was advised Release of Information must be obtained prior to any record release in order to collaborate their care with an outside provider. Patient/Guardian was advised if they have not already done so to contact the registration department to sign all necessary forms in order for [place] to release information regarding their care.   Consent: Patient/Guardian gives verbal consent for treatment and assignment of benefits for services provided during this visit. Patient/Guardian expressed understanding and agreed to proceed.   Plan: Provided talk/supportive therapy.  She reports realizing she has not been anxious or had any fear of fear since her last appointment.  Encouraged her to focus on this and be present.  Prior to leaving the appointment she confirmed she was in a stable and safe mindset.  She reports no SI, HI, or AVH.   GAD  MDD:  -Continue Effexor XR 75 mg daily for depression and anxiety.  No refills sent at this time. -Continue Remeron 15 mg QHS for depression, andxiety, sleep, and appetite.  No refills sent at this time. -Continue Propanolol 10 mg TID for anxiety and HTN.  No refills sent at this time. -Continue Hydroxyzine 25 mg TID PRN for anxiety.  No refills sent at this time.   Lauro Franklin, MD 06/01/2023

## 2023-06-03 ENCOUNTER — Other Ambulatory Visit (HOSPITAL_COMMUNITY): Payer: Self-pay | Admitting: Student in an Organized Health Care Education/Training Program

## 2023-06-03 DIAGNOSIS — F411 Generalized anxiety disorder: Secondary | ICD-10-CM

## 2023-06-03 DIAGNOSIS — F332 Major depressive disorder, recurrent severe without psychotic features: Secondary | ICD-10-CM

## 2023-06-04 ENCOUNTER — Other Ambulatory Visit: Payer: Self-pay

## 2023-06-04 MED ORDER — VENLAFAXINE HCL ER 75 MG PO CP24
75.0000 mg | ORAL_CAPSULE | Freq: Every day | ORAL | 1 refills | Status: DC
Start: 2023-06-04 — End: 2023-07-06
  Filled 2023-06-04 – 2023-07-02 (×2): qty 30, 30d supply, fill #0

## 2023-06-04 NOTE — Telephone Encounter (Signed)
Received message from patient's pharmacy for refill of her Effexor.  This was sent in.   Sent: -Effexor XR 75 mg daily.  30 capsules with 1 refill.    Arna Snipe MD Resident

## 2023-06-15 ENCOUNTER — Encounter (HOSPITAL_COMMUNITY): Payer: Self-pay | Admitting: Student in an Organized Health Care Education/Training Program

## 2023-06-15 ENCOUNTER — Other Ambulatory Visit: Payer: Self-pay

## 2023-06-15 ENCOUNTER — Ambulatory Visit (INDEPENDENT_AMBULATORY_CARE_PROVIDER_SITE_OTHER): Payer: Medicaid Other | Admitting: Student in an Organized Health Care Education/Training Program

## 2023-06-15 DIAGNOSIS — F411 Generalized anxiety disorder: Secondary | ICD-10-CM

## 2023-06-15 DIAGNOSIS — F332 Major depressive disorder, recurrent severe without psychotic features: Secondary | ICD-10-CM | POA: Diagnosis not present

## 2023-06-15 NOTE — Progress Notes (Signed)
[facility] [address] [place] [address] Dept: [phone] Dept Fax: [phone]  Psychotherapy Progress Note  Patient ID: Sydney Mccoy, female  DOB: 1971-05-10, 52 y.o.  MRN: [id number]  06/15/2023 Start time: 8:34 AM End time: 9:07 AM  Method of Visit: Face-to-Face  Present: patient  Current Concerns:  She reports that so far the holidays have been okay.  She reports that before Thanksgiving they did empty out the storage unit that contained her things from [place].  She reports it was really tough for her and that her and [name] did get into a significant argument and he did say that she did not even have a house so while she tried to save things.  She reports that they did work it out but that it was still hurtful.  She reports that Thanksgiving itself went well.  She reports Madelaine Bhat did go to [place] over the weekend and though he had said it would not interfere with them doing things he did cancel plans Monday.  She reports that she did get upset about this and ignored his phone calls.  She reports that this did lead to Madelaine Bhat leaving for [place] on Tuesday but that they did meet and talk Tuesday morning before he left.  She reports that despite all of this she has continued to overall be doing better.  She reports in the middle of all of this there were some concerns of her fear coming back but it did not and she did not have a panic attack.  She reports her plan is still to get a job after the holidays.  Encouraged her to continue focusing on her positive changes.  Prior to leaving the appointment she confirmed she was in a stable and safe mindset.  She reports no SI, HI, or AVH.   Current Symptoms: Family Stress  Psychiatric Specialty Exam: General Appearance: Casual and Fairly Groomed  Eye Contact:  Good  Speech:  Clear and Coherent and Normal Rate  Volume:  Normal  Mood:  Euthymic  Affect:  Appropriate and  Congruent  Thought Process:  Coherent and Goal Directed  Orientation:  Full (Time, Place, and Person)  Thought Content:  WDL and Logical  Suicidal Thoughts:  No  Homicidal Thoughts:  No  Memory:  Immediate;   Good Recent;   Good  Judgement:  Good  Insight:  Good  Psychomotor Activity:  Normal  Concentration:  Concentration: Good and Attention Span: Good  Recall:  Good  Fund of Knowledge:Good  Language: Good  Akathisia:  Negative  Handed:  Right  AIMS (if indicated):  not done  Assets:  Communication Skills Desire for Improvement Housing Resilience Social Support  ADL's:  Intact  Cognition: WNL  Sleep:  Good     Diagnosis: GAD with Panic Disorder, MDD, Recurrent, Severe, w/out Psychosis, EtOH Abuse   Anticipated Frequency of Visits: every 2 weeks Anticipated Length of Treatment Episode: 20  Short Term Goals/Goals for Treatment Session: Continue focusing on positive gains made Progress Towards Goals: Progressing as evidenced by her ability to navigate the stressful family situation.  Treatment Intervention: Insight-oriented therapy and Supportive therapy  Medical Necessity: Assisted patient to achieve or maintain maximum functional capacity  Assessment Tools:    02/23/2023   10:51 AM 06/16/2022   10:15 AM 04/13/2022    9:39 AM  Depression screen PHQ 2/9  Decreased Interest 2 0 3  Down, Depressed, Hopeless 1 0 3  PHQ - 2 Score  3 0 6  Altered sleeping 0 0 3  Tired, decreased energy 2 2 3   Change in appetite 3 0 3  Feeling bad or failure about yourself  2 2 3   Trouble concentrating 0 3 3  Moving slowly or fidgety/restless 0 0 3  Suicidal thoughts 0 0 0  PHQ-9 Score 10 7 24   Difficult doing work/chores  Somewhat difficult    Failed to redirect to the Timeline version of the REVFS SmartLink. Flowsheet Row ED from 01/16/2023 in [facility] at [facility] Most recent reading at 01/16/2023  4:10 PM ED from 01/16/2023 in [facility]  at [facility] Most recent reading at 01/16/2023  2:53 PM  C-SSRS RISK CATEGORY No Risk No Risk       Collaboration of Care:   Patient/Guardian was advised Release of Information must be obtained prior to any record release in order to collaborate their care with an outside provider. Patient/Guardian was advised if they have not already done so to contact the registration department to sign all necessary forms in order for [place] to release information regarding their care.   Consent: Patient/Guardian gives verbal consent for treatment and assignment of benefits for services provided during this visit. Patient/Guardian expressed understanding and agreed to proceed.   Plan: Provided talk/supportive therapy.  She reports some recent struggles with family, however, she has navigated them well and continues to focus on how she feels more positive than she has in a long time.  Discussed with her the importance of continuing to focus on this.  Prior to leaving the appointment she confirmed she was in a stable and safe mindset.  She reports no SI, HI, or AVH.    GAD  MDD:  -Continue Effexor XR 75 mg daily for depression and anxiety.  No refills sent at this time. -Continue Remeron 15 mg QHS for depression, andxiety, sleep, and appetite.  No refills sent at this time. -Continue Propanolol 10 mg TID for anxiety and HTN.  No refills sent at this time. -Continue Hydroxyzine 25 mg TID PRN for anxiety.  No refills sent at this time.   Lauro Franklin, MD 06/15/2023

## 2023-07-02 ENCOUNTER — Other Ambulatory Visit: Payer: Self-pay

## 2023-07-06 ENCOUNTER — Ambulatory Visit (INDEPENDENT_AMBULATORY_CARE_PROVIDER_SITE_OTHER): Payer: Medicaid Other | Admitting: Student in an Organized Health Care Education/Training Program

## 2023-07-06 ENCOUNTER — Encounter (HOSPITAL_COMMUNITY): Payer: Self-pay | Admitting: Student in an Organized Health Care Education/Training Program

## 2023-07-06 ENCOUNTER — Other Ambulatory Visit: Payer: Self-pay

## 2023-07-06 DIAGNOSIS — F332 Major depressive disorder, recurrent severe without psychotic features: Secondary | ICD-10-CM

## 2023-07-06 DIAGNOSIS — F410 Panic disorder [episodic paroxysmal anxiety] without agoraphobia: Secondary | ICD-10-CM | POA: Diagnosis not present

## 2023-07-06 DIAGNOSIS — F411 Generalized anxiety disorder: Secondary | ICD-10-CM

## 2023-07-06 MED ORDER — VENLAFAXINE HCL ER 75 MG PO CP24
75.0000 mg | ORAL_CAPSULE | Freq: Every day | ORAL | 1 refills | Status: DC
Start: 2023-07-06 — End: 2023-09-28
  Filled 2023-07-06 – 2023-08-08 (×2): qty 30, 30d supply, fill #0
  Filled 2023-09-03: qty 30, 30d supply, fill #1

## 2023-07-06 MED ORDER — VENLAFAXINE HCL ER 37.5 MG PO CP24
37.5000 mg | ORAL_CAPSULE | Freq: Every day | ORAL | 1 refills | Status: DC
Start: 2023-07-06 — End: 2023-09-03
  Filled 2023-07-06: qty 30, 30d supply, fill #0
  Filled 2023-08-08: qty 30, 30d supply, fill #1

## 2023-07-06 MED ORDER — VENLAFAXINE HCL ER 37.5 MG PO CP24
112.5000 mg | ORAL_CAPSULE | Freq: Every day | ORAL | 1 refills | Status: DC
Start: 2023-07-06 — End: 2023-07-06
  Filled 2023-07-06: qty 90, 30d supply, fill #0

## 2023-07-06 MED ORDER — PROPRANOLOL HCL 20 MG PO TABS
20.0000 mg | ORAL_TABLET | Freq: Three times a day (TID) | ORAL | 1 refills | Status: DC
  Filled 2023-07-06: qty 90, 30d supply, fill #0
  Filled 2023-08-08: qty 90, 30d supply, fill #1

## 2023-07-06 NOTE — Addendum Note (Signed)
Addended by: Lauro Franklin on: 07/06/2023 09:09 AM   Modules accepted: Orders

## 2023-07-06 NOTE — Progress Notes (Addendum)
[facility] [address] [place] [address] Dept: [phone] Dept Fax: [phone]  Psychotherapy Progress Note  Patient ID: Sydney Mccoy, female  DOB: 1971-01-01, 52 y.o.  MRN: [id number]  07/06/2023 Start time: 8:06 AM End time: 8:52 AM  Method of Visit: Face-to-Face  Present: patient  Current Concerns:  She reports continuing to have significant issues with anxiety.  She reports that she has not had any panic attacks but we will feel anxious at things that never made her anxious.  She reports that [name] and his girlfriend will be coming to visit and she will be meeting Alex's girlfriend's mother for the first time.  She reports anxiety over this due to "not measuring up to her."  She reports that the girlfriend's mother is an attorney who used to work with her father and is now a judge.  Guided her through the thought process of what their different positions meant and she reports understanding that ultimately it does not matter.  She reports she does feel like her mother will spend a lot of the time talking with the girlfriend's mother which will help reduce some of the stress.  She reports her mother was jealous of the fact that she was looking for a good present for [name]'s father Rayna Sexton due to Erskine Squibb passing.  Discussed with her that it could be her mother not processing the loss of her husband 26 years ago and also that it could be throwing into sharp contrast her mother's own mortality.  Encouraged her to begin daily meditation/journaling.  Also discussed with her positive affirmations as a way to counter the automatic negative thoughts which she has been having.  She reports she does have a journal that Madelaine Bhat gave her and so she will begin daily writing.  Encouraged her to continue focusing on the positive progress she has made and she does report understanding she has made significant strides.  Prior to leaving the  appointment she confirmed she was in a stable and safe mindset.  She reports no SI, HI, or AVH.    Current Symptoms: Family Stress  Psychiatric Specialty Exam: General Appearance: Casual and Fairly Groomed  Eye Contact:  Good  Speech:  Clear and Coherent and Normal Rate  Volume:  Normal  Mood:  Anxious  Affect:  Congruent  Thought Process:  Coherent and Goal Directed  Orientation:  Full (Time, Place, and Person)  Thought Content:  WDL and Logical  Suicidal Thoughts:  No  Homicidal Thoughts:  No  Memory:  Immediate;   Good Recent;   Good  Judgement:  Good  Insight:  Good  Psychomotor Activity:  Normal  Concentration:  Concentration: Good and Attention Span: Good  Recall:  Good  Fund of Knowledge:Good  Language: Good  Akathisia:  Negative  Handed:  Right  AIMS (if indicated):  not done  Assets:  Communication Skills Desire for Improvement Housing Resilience Social Support  ADL's:  Intact  Cognition: WNL  Sleep:  Good     Diagnosis: GAD with Panic Disorder, MDD, Recurrent, Severe, w/out Psychosis, EtOH Abuse   Anticipated Frequency of Visits: every 2 weeks Anticipated Length of Treatment Episode: 20  Short Term Goals/Goals for Treatment Session: Begin daily journaling and daily positive affirmation/gratitude Progress Towards Goals: Initial   Treatment Intervention: Insight-oriented therapy and Supportive therapy  Medical Necessity: Assisted patient to achieve or maintain maximum functional capacity  Assessment Tools:    02/23/2023   10:51 AM 06/16/2022  10:15 AM 04/13/2022    9:39 AM  Depression screen PHQ 2/9  Decreased Interest 2 0 3  Down, Depressed, Hopeless 1 0 3  PHQ - 2 Score 3 0 6  Altered sleeping 0 0 3  Tired, decreased energy 2 2 3   Change in appetite 3 0 3  Feeling bad or failure about yourself  2 2 3   Trouble concentrating 0 3 3  Moving slowly or fidgety/restless 0 0 3  Suicidal thoughts 0 0 0  PHQ-9 Score 10 7 24   Difficult doing  work/chores  Somewhat difficult    Failed to redirect to the Timeline version of the REVFS SmartLink. Flowsheet Row ED from 01/16/2023 in [facility] at [facility] Most recent reading at 01/16/2023  4:10 PM ED from 01/16/2023 in [facility] Ur[facility] Care at [facility] Most recent reading at 01/16/2023  2:53 PM  C-SSRS RISK CATEGORY No Risk No Risk       Collaboration of Care:   Patient/Guardian was advised Release of Information must be obtained prior to any record release in order to collaborate their care with an outside provider. Patient/Guardian was advised if they have not already done so to contact the registration department to sign all necessary forms in order for [place] to release information regarding their care.   Consent: Patient/Guardian gives verbal consent for treatment and assignment of benefits for services provided during this visit. Patient/Guardian expressed understanding and agreed to proceed.   Plan: Provided talk/supportive therapy.  She reports continuing to have significant anxiety.  To address this we will increase her Effexor and propranolol at this time.  She will begin journaling and focusing on avoiding automatic negative thoughts.  Prior to leaving the appointment she confirmed she was in a stable and safe mindset.  She reports no SI, HI, or AVH.     GAD  MDD:  -Increase Effexor XR to 112.5 mg daily for depression and anxiety.  90 (37.5 mg) capsules with 1 refill, -Continue Remeron 15 mg QHS for depression, andxiety, sleep, and appetite.  No refills sent at this time. -Increase Propanolol to 20 mg TID for anxiety and HTN.  60 tablets with 1 refill. -Continue Hydroxyzine 25 mg TID PRN for anxiety.  No refills sent at this time.    Lauro Franklin, MD 07/06/2023

## 2023-07-12 ENCOUNTER — Other Ambulatory Visit: Payer: Self-pay

## 2023-07-14 ENCOUNTER — Other Ambulatory Visit (HOSPITAL_COMMUNITY): Payer: Self-pay | Admitting: Student in an Organized Health Care Education/Training Program

## 2023-07-14 DIAGNOSIS — F332 Major depressive disorder, recurrent severe without psychotic features: Secondary | ICD-10-CM

## 2023-07-14 DIAGNOSIS — F410 Panic disorder [episodic paroxysmal anxiety] without agoraphobia: Secondary | ICD-10-CM

## 2023-07-14 DIAGNOSIS — F411 Generalized anxiety disorder: Secondary | ICD-10-CM

## 2023-07-17 ENCOUNTER — Other Ambulatory Visit: Payer: Self-pay

## 2023-07-17 ENCOUNTER — Other Ambulatory Visit (INDEPENDENT_AMBULATORY_CARE_PROVIDER_SITE_OTHER): Payer: Medicaid Other

## 2023-07-17 ENCOUNTER — Ambulatory Visit: Payer: Medicaid Other | Admitting: Physician Assistant

## 2023-07-17 ENCOUNTER — Ambulatory Visit (INDEPENDENT_AMBULATORY_CARE_PROVIDER_SITE_OTHER): Payer: Medicaid Other

## 2023-07-17 ENCOUNTER — Encounter: Payer: Self-pay | Admitting: Physician Assistant

## 2023-07-17 VITALS — BP 124/74 | HR 89 | Ht 66.5 in | Wt 193.0 lb

## 2023-07-17 DIAGNOSIS — K7460 Unspecified cirrhosis of liver: Secondary | ICD-10-CM

## 2023-07-17 DIAGNOSIS — Z8719 Personal history of other diseases of the digestive system: Secondary | ICD-10-CM

## 2023-07-17 DIAGNOSIS — F411 Generalized anxiety disorder: Secondary | ICD-10-CM | POA: Insufficient documentation

## 2023-07-17 DIAGNOSIS — K921 Melena: Secondary | ICD-10-CM | POA: Diagnosis not present

## 2023-07-17 DIAGNOSIS — J449 Chronic obstructive pulmonary disease, unspecified: Secondary | ICD-10-CM | POA: Insufficient documentation

## 2023-07-17 LAB — COMPREHENSIVE METABOLIC PANEL
ALT: 61 U/L — ABNORMAL HIGH (ref 0–35)
AST: 63 U/L — ABNORMAL HIGH (ref 0–37)
Albumin: 4.3 g/dL (ref 3.5–5.2)
Alkaline Phosphatase: 68 U/L (ref 39–117)
BUN: 9 mg/dL (ref 6–23)
CO2: 26 meq/L (ref 19–32)
Calcium: 9.3 mg/dL (ref 8.4–10.5)
Chloride: 103 meq/L (ref 96–112)
Creatinine, Ser: 0.67 mg/dL (ref 0.40–1.20)
GFR: 100.11 mL/min (ref >60.00)
Glucose, Bld: 94 mg/dL (ref 70–99)
Potassium: 4.3 meq/L (ref 3.5–5.1)
Sodium: 139 meq/L (ref 135–145)
Total Bilirubin: 0.8 mg/dL (ref 0.2–1.2)
Total Protein: 8.2 g/dL (ref 6.0–8.3)

## 2023-07-17 LAB — CBC WITH DIFFERENTIAL/PLATELET
Basophils Absolute: 0 10*3/uL (ref 0.0–0.1)
Basophils Relative: 0.9 % (ref 0.0–3.0)
Eosinophils Absolute: 0.1 10*3/uL (ref 0.0–0.7)
Eosinophils Relative: 2.4 % (ref 0.0–5.0)
HCT: 39.6 % (ref 36.0–46.0)
Hemoglobin: 13.3 g/dL (ref 12.0–15.0)
Lymphocytes Relative: 38.6 % (ref 12.0–46.0)
Lymphs Abs: 1.7 10*3/uL (ref 0.7–4.0)
MCHC: 33.4 g/dL (ref 30.0–36.0)
MCV: 96.3 fL (ref 78.0–100.0)
Monocytes Absolute: 0.3 10*3/uL (ref 0.1–1.0)
Monocytes Relative: 7.3 % (ref 3.0–12.0)
Neutro Abs: 2.2 10*3/uL (ref 1.4–7.7)
Neutrophils Relative %: 50.8 % (ref 43.0–77.0)
Platelets: 102 10*3/uL — ABNORMAL LOW (ref 150.0–400.0)
RBC: 4.12 Mil/uL (ref 3.87–5.11)
RDW: 13.9 % (ref 11.5–15.5)
WBC: 4.3 10*3/uL (ref 4.0–10.5)

## 2023-07-17 LAB — PROTIME-INR
INR: 1.4 {ratio} — ABNORMAL HIGH (ref 0.8–1.0)
Prothrombin Time: 14.4 s — ABNORMAL HIGH (ref 9.6–13.1)

## 2023-07-17 LAB — FERRITIN: Ferritin: 261.6 ng/mL (ref 10.0–291.0)

## 2023-07-17 NOTE — Patient Instructions (Signed)
 Your provider has requested that you go to the basement level for lab work before leaving today. Press B on the elevator. The lab is located at the first door on the left as you exit the elevator.  You have been scheduled for an abdominal ultrasound at [facility] (1st floor of hospital) on 07/23/2023 at 7:00am. Please arrive 15 minutes prior to your appointment for registration. Make certain not to have anything to eat or drink after midnight prior to your appointment. Should you need to reschedule your appointment, please contact radiology at [phone]. This test typically takes about 30 minutes to perform.  Decrease alcohol use  You will be established with Dr. Albertus - please follow up in 4-6 months  _______________________________________________________  If your blood pressure at your visit was 140/90 or greater, please contact your primary care physician to follow up on this.  _______________________________________________________  If you are age 53 or older, your body mass index should be between 23-30. Your Body mass index is 30.68 kg/m. If this is out of the aforementioned range listed, please consider follow up with your Primary Care Provider.  If you are age 28 or younger, your body mass index should be between 19-25. Your Body mass index is 30.68 kg/m. If this is out of the aformentioned range listed, please consider follow up with your Primary Care Provider.   ________________________________________________________  The [facility] providers would like to encourage you to use MYCHART to communicate with providers for non-urgent requests or questions.  Due to long hold times on the telephone, sending your provider a message by [facility] may be a faster and more efficient way to get a response.  Please allow 48 business hours for a response.  Please remember that this is for non-urgent requests.  _______________________________________________________

## 2023-07-17 NOTE — Progress Notes (Signed)
 Addendum: Reviewed and agree with assessment and management plan. Asha Grumbine, Carie Caddy, MD

## 2023-07-17 NOTE — Progress Notes (Signed)
 Subjective:    Patient ID: Sydney Mccoy, female    DOB: Oct 23, 1970, 53 y.o.   MRN: [id number]  HPI  Sydney Mccoy is a 53 year old white female, new to GI today referred after an ER visit in July 2024 at which time a CT of the abdomen and pelvis done for complaints of abdominal pain showed evidence of a cirrhotic appearing liver, as well as 1 small layering gallstones, no ductal dilation, normal-appearing spleen, as well as a few left colon diverticuli and trace perihepatic ascites. Labs at that time with hemoglobin 12.9/platelets 135/pro time 15.9/INR 1.3 and LFTs within normal limits.  Patient says as far as her abdominal pain is concerned that she really has not had any further significant episodes of pain.  At that time she had primarily felt that on the right side, it was not associated with nausea or vomiting.  She says she might get some vague discomfort about once a month.  She also mentions that she is usually very regular having 3-4 bowel movements per day. She is status post a colon perforation in 2006 secondary to diverticulitis, this required a colon resection and temporary colostomy which was reversed in 2007.  This was complicated by an incisional hernia and she had hernia repair with mesh in 2007.  She is also separately had an oophorectomy. She says that she believes she had a colonoscopy in 2016 here in [place] but does not remember where that was done and probably had a couple of polyps. She is not aware of any prior diagnosis of liver disease or cirrhosis.  She did have a CT of the abdomen pelvis with contrast in 2016 which showed the liver to be normal-appearing with a few hepatic cysts. She has no prior history of hepatitis, does have a brother with cirrhosis but says he is an alcoholic.,  No other family history of liver disease.  Patient herself does have history of heavy daily alcohol use at least over about a 3-year.,  Says she was self-medicating at that time.  Currently  she is not drinking on a daily basis but usually drinks 3-4 beers per day on the weekends Friday through Sunday. Other medical problems include depression, panic disorder, generalized anxiety disorder, COPD. She currently does not have a PCP.  She mentions that occasionally she sees a small amount of bright red blood on the tissue, this has been a long-term issue she thinks from having frequent bowel movements, no anorectal discomfort or pain.  Review of Systems Pertinent positive and negative review of systems were noted in the above HPI section.  All other review of systems was otherwise negative.   Outpatient Encounter Medications as of 07/17/2023  Medication Sig   mirtazapine  (REMERON ) 15 MG tablet Take 1 tablet (15 mg total) by mouth at bedtime.   propranolol  (INDERAL ) 20 MG tablet Take 1 tablet (20 mg total) by mouth 3 (three) times daily.   venlafaxine  XR (EFFEXOR  XR) 37.5 MG 24 hr capsule Take 1 capsule (37.5 mg total) by mouth daily. Take with the 75 mg capsule daily   venlafaxine  XR (EFFEXOR  XR) 75 MG 24 hr capsule Take 1 capsule (75 mg total) by mouth daily. Take with the 37.5 mg capsule daily   clonazePAM  (KLONOPIN ) 1 MG tablet Take 1 tablet (1 mg total) by mouth 2 (two) times daily as needed for anxiety. (Patient not taking: Reported on 07/17/2023)   hydrOXYzine  (ATARAX ) 25 MG tablet Take 1 tablet (25 mg total) by mouth 3 (three)  times daily as needed. (Patient not taking: Reported on 07/17/2023)   No facility-administered encounter medications on file as of 07/17/2023.   Not on File Patient Active Problem List   Diagnosis Date Noted   Generalized anxiety disorder 07/17/2023   COPD (chronic obstructive pulmonary disease) (HCC) 07/17/2023   MDD (major depressive disorder) 01/19/2023   Social History   Socioeconomic History   Marital status: Divorced    Spouse name: Not on file   Number of children: 1   Years of education: Not on file   Highest education level: Not on file   Occupational History   Occupation: N/A  Tobacco Use   Smoking status: Former    Types: Cigarettes   Smokeless tobacco: Never  Vaping Use   Vaping status: Never Used  Substance and Sexual Activity   Alcohol use: Yes    Alcohol/week: 28.0 standard drinks of alcohol    Types: 28 Standard drinks or equivalent per week    Comment: 4 beers on the weekend   Drug use: Yes    Comment: CBD oil   Sexual activity: Not on file  Other Topics Concern   Not on file  Social History Narrative   Not on file   Social Drivers of Health   Financial Resource Strain: Not on file  Food Insecurity: Not on file  Transportation Needs: Not on file  Physical Activity: Not on file  Stress: Not on file  Social Connections: Not on file  Intimate Partner Violence: Not on file    Ms. Anastasia's family history includes Heart attack in her father.      Objective:    Vitals:   07/17/23 1129  BP: 124/74  Pulse: 89    Physical Exam Well-developed well-nourished older WF  in no acute distress.  Height, Tzphyu,806  BMI30.6  HEENT; nontraumatic normocephalic, EOMI, PE R LA, sclera anicteric. Oropharynx;not done Neck; supple, no JVD Cardiovascular; regular rate and rhythm with S1-S2, no murmur rub or gallop Pulmonary; Clear bilaterally Abdomen; soft, nontender, nondistended, no palpable mass , no fluid wave, liver is palpable 2 to 3 fingerbreadths below the right costal margin, and in the epigastrium, no palpable splenomegaly, midline incisional scar and prior ostomy scar Rectal; not done Skin; benign exam, no jaundice rash or appreciable lesions Extremities; no clubbing cyanosis or edema skin warm and dry Neuro/Psych; alert and oriented x4, grossly nonfocal mood and affect appropriate        Assessment & Plan:   #53 53 year old white female with new diagnosis of cirrhosis made at the time of CT abdomen and pelvis July 2024, normal-appearing spleen at that time trace perihepatic ascites Normal LFTs  and INR of 1.3  Etiology of her cirrhosis is not entirely clear, suspect secondary to combination of EtOH and fatty liver disease/NAFLD Will need to rule out autoimmune , viral and inheritable forms of liver disease.  Disease appears to be compensated at present, will repeat labs to calculate current MEL D  #2 colon cancer screening-patient reports colonoscopy done in 2016 may have had a couple of polyps, unclear about follow-up, believes procedure was done in [place].  #3 history of complicated diverticulitis with perforation 2006 requiring colonic resection and temporary colostomy which was reversed in 2007. She then had incisional hernia requiring repair with mesh 2007.  #4 COPD #5.  Diverticulosis #6.  Generalized anxiety disorder and depression #7 single gallstone noted on CT    Plan; Patient will be scheduled for ultrasound for surveillance Check AFP, CBC with  differential, c-Met, pro time/INR Hepatitis serologies, ANA, AMA, smooth muscle antibody, ferritin ceruloplasmin and alpha-1 antitrypsin We discussed indication to decrease her current alcohol use significantly best advice is to drink no alcohol. Patient is signed a release and will try to obtain copies of her prior colonoscopy records from [facility] and/or [facility], then determine timing of repeat colonoscopy.  I suspect she will be due for colonoscopy, and if that is the case she may best be served by also scheduling for screening EGD at that time. Patient will be established with Dr. Albertus, and will need office follow-up with Dr. Albertus in 4 to 6 months.    Kaelyn Innocent S Javarius Tsosie PA-C 07/17/2023   Cc: Gowens, Mariah L, PA-C

## 2023-07-20 ENCOUNTER — Other Ambulatory Visit: Payer: Self-pay

## 2023-07-20 LAB — CERULOPLASMIN: Ceruloplasmin: 23 mg/dL (ref 14–48)

## 2023-07-20 LAB — HEPATITIS B SURFACE ANTIGEN: Hepatitis B Surface Ag: NONREACTIVE

## 2023-07-20 LAB — ANTI-NUCLEAR AB-TITER (ANA TITER): ANA Titer 1: 1:1280 {titer} — ABNORMAL HIGH

## 2023-07-20 LAB — AFP TUMOR MARKER: AFP-Tumor Marker: 9.4 ng/mL — ABNORMAL HIGH

## 2023-07-20 LAB — MITOCHONDRIAL ANTIBODIES: Mitochondrial M2 Ab, IgG: <=20 U (ref <=20.0)

## 2023-07-20 LAB — ANA: Anti Nuclear Antibody (ANA): POSITIVE — AB

## 2023-07-20 LAB — HEPATITIS A ANTIBODY, TOTAL: Hepatitis A AB,Total: REACTIVE — AB

## 2023-07-20 LAB — HEPATITIS C ANTIBODY: Hepatitis C Ab: NONREACTIVE

## 2023-07-20 LAB — HEPATITIS B SURFACE ANTIBODY,QUALITATIVE: Hep B S Ab: REACTIVE — AB

## 2023-07-20 LAB — ANTI-SMOOTH MUSCLE ANTIBODY, IGG: Actin (Smooth Muscle) Antibody (IGG): <20 U (ref <20)

## 2023-07-20 LAB — ALPHA-1-ANTITRYPSIN: A-1 Antitrypsin, Ser: 154 mg/dL (ref 83–199)

## 2023-07-20 MED ORDER — MIRTAZAPINE 15 MG PO TABS
15.0000 mg | ORAL_TABLET | Freq: Every day | ORAL | 1 refills | Status: DC
Start: 2023-07-20 — End: 2023-09-07
  Filled 2023-07-20: qty 30, 30d supply, fill #0
  Filled 2023-08-08 – 2023-08-16 (×2): qty 30, 30d supply, fill #1

## 2023-07-20 NOTE — Telephone Encounter (Signed)
 Received message from patient's pharmacy that she needed a refill of her Remeron.  This was sent in.   Sent: -Remeron 15 mg QHS.  30 tablets with 1 refill.    Arna Snipe MD Resident

## 2023-07-23 ENCOUNTER — Ambulatory Visit (HOSPITAL_COMMUNITY)
Admission: RE | Admit: 2023-07-23 | Discharge: 2023-07-23 | Disposition: A | Discharge status: Home / Self Care | Payer: Medicaid Other | Source: Ambulatory Visit | Attending: Physician Assistant | Admitting: Physician Assistant

## 2023-07-23 DIAGNOSIS — K7460 Unspecified cirrhosis of liver: Secondary | ICD-10-CM | POA: Insufficient documentation

## 2023-07-23 DIAGNOSIS — K921 Melena: Secondary | ICD-10-CM | POA: Insufficient documentation

## 2023-07-27 ENCOUNTER — Encounter (HOSPITAL_COMMUNITY): Payer: Self-pay | Admitting: Student in an Organized Health Care Education/Training Program

## 2023-07-27 ENCOUNTER — Ambulatory Visit (INDEPENDENT_AMBULATORY_CARE_PROVIDER_SITE_OTHER): Payer: Medicaid Other | Admitting: Student in an Organized Health Care Education/Training Program

## 2023-07-27 DIAGNOSIS — F411 Generalized anxiety disorder: Secondary | ICD-10-CM | POA: Diagnosis not present

## 2023-07-27 DIAGNOSIS — F332 Major depressive disorder, recurrent severe without psychotic features: Secondary | ICD-10-CM | POA: Diagnosis not present

## 2023-07-27 NOTE — Progress Notes (Signed)
[facility] [address] [place] [address] Dept: [phone] Dept Fax: [phone]  Psychotherapy Progress Note  Patient ID: Sydney Mccoy, female  DOB: 1971-04-16, 53 y.o.  MRN: [id number]  07/27/2023 Start time: 9:03 AM End time: 9:48 AM  Method of Visit: Face-to-Face  Present: patient  Current Concerns:  She reports she has been doing all right.  She reports that due to a few people being sick over Christmas there was not as much stress so it did go better than she thought it might.  She reports that it was different this year given Sydney Mccoy's mother's passing.  She reports that incentive Christmas dinner being done at home it was at a restaurant which was "different" but overall it was fine.  She reports she has had increased stress and anxiety due to her liver issues.  She reports that she did go to the GI specialist.  She reports she had a lot of blood work done and there is concern for autoimmune issues.  She reports she is being scheduled for an endoscopy and colonoscopy.  She reports that the propranolol has been helpful in reducing her anxiety.  She reports still having anxiety but it has not reached the level of panic attacks which she had been having.  She reports she has not yet started the daily positive journal.  Discussed with her that it does not have to be grandiose things.  Discussed with her that it can be a single sentence of being grateful she woke up.  Discussed with her that the hope is this will break the automatic negative thinking and help reinforce positive thinking to grow up on itself to larger things.  She reports that now knowing that this is more approachable.  She reports she has been watching "Shrinking" on [employer] and is enjoying it and looking forward to it.  She reports continuing to have concerns over her anxiety surrounding Sydney Mccoy calling her.  Discussed with her that 1 goal for her will be to  call Sydney Mccoy once a week instead of waiting on him calling.  Discussed with her that the goal of this would be to eliminate the anxiety surrounding him calling since she would be in control of it and she reports this is something she can do.  Prior to leaving the appointment she confirmed she was in a stable and safe mindset.  She reports no SI, HI, or AVH.    Current Symptoms: Family Stress  Psychiatric Specialty Exam: General Appearance: Casual and Fairly Groomed  Eye Contact:  Good  Speech:  Clear and Coherent and Normal Rate  Volume:  Normal  Mood:  Anxious  Affect:  Congruent  Thought Process:  Coherent and Goal Directed  Orientation:  Full (Time, Place, and Person)  Thought Content:  WDL and Logical  Suicidal Thoughts:  No  Homicidal Thoughts:  No  Memory:  Immediate;   Good Recent;   Good  Judgement:  Good  Insight:  Good  Psychomotor Activity:  Normal  Concentration:  Concentration: Good and Attention Span: Good  Recall:  Good  Fund of Knowledge:Good  Language: Good  Akathisia:  Negative  Handed:  Right  AIMS (if indicated):  not done  Assets:  Communication Skills Desire for Improvement Housing Resilience Social Support  ADL's:  Intact  Cognition: WNL  Sleep:  Good     Diagnosis: GAD with Panic Disorder, MDD, Recurrent, Severe, w/out Psychosis, EtOH Abuse   Anticipated Frequency of  Visits: every 2 weeks Anticipated Length of Treatment Episode: 20  Short Term Goals/Goals for Treatment Session: Begin daily journaling and daily positive affirmation/gratitude, call Sydney Mccoy at least once a week Progress Towards Goals: Initial   Treatment Intervention: Insight-oriented therapy and Supportive therapy  Medical Necessity: Assisted patient to achieve or maintain maximum functional capacity  Assessment Tools:    02/23/2023   10:51 AM 06/16/2022   10:15 AM 04/13/2022    9:39 AM  Depression screen PHQ 2/9  Decreased Interest 2 0 3  Down, Depressed, Hopeless 1 0 3   PHQ - 2 Score 3 0 6  Altered sleeping 0 0 3  Tired, decreased energy 2 2 3   Change in appetite 3 0 3  Feeling bad or failure about yourself  2 2 3   Trouble concentrating 0 3 3  Moving slowly or fidgety/restless 0 0 3  Suicidal thoughts 0 0 0  PHQ-9 Score 10 7 24   Difficult doing work/chores  Somewhat difficult    Failed to redirect to the Timeline version of the REVFS SmartLink. Flowsheet Row ED from 01/16/2023 in [facility] Department at [facility] Most recent reading at 01/16/2023  4:10 PM ED from 01/16/2023 in [facility] Care at [facility] Most recent reading at 01/16/2023  2:53 PM  C-SSRS RISK CATEGORY No Risk No Risk       Collaboration of Care:   Patient/Guardian was advised Release of Information must be obtained prior to any record release in order to collaborate their care with an outside provider. Patient/Guardian was advised if they have not already done so to contact the registration department to sign all necessary forms in order for [place] to release information regarding their care.   Consent: Patient/Guardian gives verbal consent for treatment and assignment of benefits for services provided during this visit. Patient/Guardian expressed understanding and agreed to proceed.   Plan: Provided talk/supportive therapy.  She reports not yet starting journaling but will begin so.  She is continuing to have anxiety around Sydney Mccoy calling her so will begin calling Sydney Mccoy at least once a week.  Prior to leaving the appointment she confirmed she was in a stable and safe mindset.  She reports no SI, HI, or AVH.      GAD  MDD:  -Continue Effexor XR 112.5 mg daily for depression and anxiety.  No refills sent at this time. -Continue Remeron 15 mg QHS for depression, andxiety, sleep, and appetite.  No refills sent at this time. -Continue Propanolol 20 mg TID for anxiety and HTN.  No refills sent at this time. -Continue Hydroxyzine 25 mg TID PRN for anxiety.  No  refills sent at this time.    Lauro Franklin, MD 07/27/2023

## 2023-07-31 ENCOUNTER — Telehealth: Payer: Self-pay | Admitting: Physician Assistant

## 2023-07-31 NOTE — Telephone Encounter (Signed)
PT is returning call to discuss  lab results. Please advise.eliz

## 2023-08-01 ENCOUNTER — Other Ambulatory Visit: Payer: Self-pay

## 2023-08-01 DIAGNOSIS — K7460 Unspecified cirrhosis of liver: Secondary | ICD-10-CM

## 2023-08-01 DIAGNOSIS — R772 Abnormality of alphafetoprotein: Secondary | ICD-10-CM

## 2023-08-01 DIAGNOSIS — K838 Other specified diseases of biliary tract: Secondary | ICD-10-CM

## 2023-08-02 ENCOUNTER — Other Ambulatory Visit: Payer: Medicaid Other

## 2023-08-02 DIAGNOSIS — K838 Other specified diseases of biliary tract: Secondary | ICD-10-CM

## 2023-08-02 DIAGNOSIS — K7460 Unspecified cirrhosis of liver: Secondary | ICD-10-CM

## 2023-08-02 DIAGNOSIS — R772 Abnormality of alphafetoprotein: Secondary | ICD-10-CM

## 2023-08-03 LAB — IGG: IgG (Immunoglobin G), Serum: 1588 mg/dL (ref 600–1640)

## 2023-08-08 ENCOUNTER — Other Ambulatory Visit: Payer: Self-pay

## 2023-08-09 ENCOUNTER — Other Ambulatory Visit: Payer: Self-pay

## 2023-08-09 ENCOUNTER — Ambulatory Visit (HOSPITAL_COMMUNITY)
Admission: RE | Admit: 2023-08-09 | Discharge: 2023-08-09 | Disposition: A | Discharge status: Home / Self Care | Payer: Medicaid Other | Source: Ambulatory Visit | Attending: Physician Assistant | Admitting: Physician Assistant

## 2023-08-09 DIAGNOSIS — K8020 Calculus of gallbladder without cholecystitis without obstruction: Secondary | ICD-10-CM | POA: Diagnosis not present

## 2023-08-09 DIAGNOSIS — R772 Abnormality of alphafetoprotein: Secondary | ICD-10-CM | POA: Insufficient documentation

## 2023-08-09 DIAGNOSIS — R188 Other ascites: Secondary | ICD-10-CM | POA: Diagnosis not present

## 2023-08-09 DIAGNOSIS — K7460 Unspecified cirrhosis of liver: Secondary | ICD-10-CM | POA: Diagnosis not present

## 2023-08-09 DIAGNOSIS — C220 Liver cell carcinoma: Secondary | ICD-10-CM | POA: Diagnosis not present

## 2023-08-09 DIAGNOSIS — K838 Other specified diseases of biliary tract: Secondary | ICD-10-CM | POA: Diagnosis present

## 2023-08-09 MED ORDER — GADOBUTROL 1 MMOL/ML IV SOLN
10.0000 mL | Freq: Once | INTRAVENOUS | Status: AC | PRN
  Administered 2023-08-09: 10 mL via INTRAVENOUS

## 2023-08-17 ENCOUNTER — Encounter (HOSPITAL_COMMUNITY): Payer: Self-pay | Admitting: Student in an Organized Health Care Education/Training Program

## 2023-08-17 ENCOUNTER — Other Ambulatory Visit: Payer: Self-pay

## 2023-08-17 ENCOUNTER — Ambulatory Visit (INDEPENDENT_AMBULATORY_CARE_PROVIDER_SITE_OTHER): Payer: Medicaid Other | Admitting: Student in an Organized Health Care Education/Training Program

## 2023-08-17 VITALS — BP 138/95 | HR 88 | Ht 66.5 in | Wt 193.0 lb

## 2023-08-17 DIAGNOSIS — F332 Major depressive disorder, recurrent severe without psychotic features: Secondary | ICD-10-CM

## 2023-08-17 DIAGNOSIS — F410 Panic disorder [episodic paroxysmal anxiety] without agoraphobia: Secondary | ICD-10-CM | POA: Diagnosis not present

## 2023-08-17 DIAGNOSIS — F411 Generalized anxiety disorder: Secondary | ICD-10-CM

## 2023-08-17 NOTE — Progress Notes (Signed)
 [facility] [address] [place] [address] Dept: [phone] Dept Fax: [phone]  Psychotherapy Progress Note  Patient ID: Sydney Mccoy, female  DOB: 1970/12/08, 53 y.o.  MRN: [id number]  08/17/2023 Start time: 9:04 AM End time: 9:50 AM  Method of Visit: Face-to-Face  Present: patient  Current Concerns:  Sydney Mccoy is a 53 yr old female who presents from [facility] for Follow Up, Medication Management and Therapy.  PPHx is significant for Depression, Anxiety, Bulimia, ADHD, and Auditory Processing Disorder, Suicidal Gestures (teenage years), 1 Hospitalization (age 82- [facility]), and no History of Self Injurious Behavior.    She reports that she is doing better overall but does continue to have anxiety.  She reports that she has been able to overcome the anxiety she would have over [place] and other situations in her life.  She reports the one area she continues to have anxiety revolves around Sydney Mccoy.  She reports that she continues to associate him with anxiety/panic attacks.  She reports she knows it is not anything about their relationship or with him and it is 100% on her.  She reports that she builds it up in her mind but as soon as she says hello when he calls all of the anxiety disappears.  Discussed with her how calling him had gone.  She reports that it went really well and she did not have any anxiety from it.  She reports that he is coming to visit on the 18th and does not want this to continue being such an obstacle to her.  Discussed with her the need to change the routine and she reports she never thought of it that way.  She reports she thought if she did things the way she used to do then she would be okay but discussed with her that over time people change and so the same routine will not always have the same response and she reports understanding.  Encouraged her to call Sydney Mccoy if not daily so that she  breaks that association of Sydney Mccoy with anxiety.  She reports she will call him today.  She reports she will do better on journaling and looking in the mirror because she understands that by changing her routine to include these things her mindset can change.  Prior to leaving the appointment she confirmed she was in a stable and safe mindset.  She reports no SI, HI, or AVH.     Current Symptoms: Anxiety  Psychiatric Specialty Exam: General Appearance: Casual and Fairly Groomed  Eye Contact:  Good  Speech:  Clear and Coherent and Normal Rate  Volume:  Normal  Mood:   Ok  Affect:  Appropriate and Congruent  Thought Process:  Coherent and Goal Directed  Orientation:  Full (Time, Place, and Person)  Thought Content:  WDL and Logical  Suicidal Thoughts:  No  Homicidal Thoughts:  No  Memory:  Immediate;   Good Recent;   Good  Judgement:  Good  Insight:  Good  Psychomotor Activity:  Normal  Concentration:  Concentration: Good and Attention Span: Good  Recall:  Good  Fund of Knowledge:Good  Language: Good  Akathisia:  Negative  Handed:  Right  AIMS (if indicated):  not done  Assets:  Communication Skills Desire for Improvement Housing Resilience Social Support  ADL's:  Intact  Cognition: WNL  Sleep:  Good     Diagnosis: GAD with Panic Disorder, MDD, Recurrent, Severe, w/out Psychosis, EtOH Abuse  Anticipated Frequency of Visits: every 2 weeks Anticipated Length of Treatment Episode: 20  Short Term Goals/Goals for Treatment Session: Begin daily journaling and daily positive affirmation/gratitude, call Sydney Mccoy at least every other Mccoy Progress Towards Goals: Initial   Treatment Intervention: Insight-oriented therapy and Supportive therapy  Medical Necessity: Assisted patient to achieve or maintain maximum functional capacity  Assessment Tools:    02/23/2023   10:51 AM 06/16/2022   10:15 AM 04/13/2022    9:39 AM  Depression screen PHQ 2/9  Decreased Interest 2 0 3   Down, Depressed, Hopeless 1 0 3  PHQ - 2 Score 3 0 6  Altered sleeping 0 0 3  Tired, decreased energy 2 2 3   Change in appetite 3 0 3  Feeling bad or failure about yourself  2 2 3   Trouble concentrating 0 3 3  Moving slowly or fidgety/restless 0 0 3  Suicidal thoughts 0 0 0  PHQ-9 Score 10 7 24   Difficult doing work/chores  Somewhat difficult    Failed to redirect to the Timeline version of the REVFS SmartLink. Flowsheet Row ED from 01/16/2023 in [facility] Emergency Department at [facility] Most recent reading at 01/16/2023  4:10 PM ED from 01/16/2023 in [facility] Urgent Care at [facility] Most recent reading at 01/16/2023  2:53 PM  C-SSRS RISK CATEGORY No Risk No Risk       Collaboration of Care:   Patient/Guardian was advised Release of Information must be obtained prior to any record release in order to collaborate their care with an outside provider. Patient/Guardian was advised if they have not already done so to contact the registration department to sign all necessary forms in order for us  to release information regarding their care.   Consent: Patient/Guardian gives verbal consent for treatment and assignment of benefits for services provided during this visit. Patient/Guardian expressed understanding and agreed to proceed.   Plan:  Sydney Mccoy is a 53 yr old female who presents from [facility] for Follow Up, Medication Management and Therapy.  PPHx is significant for Depression, Anxiety, Bulimia, ADHD, and Auditory Processing Disorder, Suicidal Gestures (teenage years), 1 Hospitalization (age 24- [facility]), and no History of Self Injurious Behavior.   Provided talk/supportive therapy.  She reports when she called Sydney Mccoy she did not have an anxious response.  Discussed with her the importance of changing her routine to change her mindset and encouraged her to call Sydney Mccoy if not daily.  She did seem to make insightful discoveries.  Prior to leaving the  appointment she confirmed she was in a stable and safe mindset.  She reports no SI, HI, or AVH.     GAD  MDD:  -Continue Effexor  XR 112.5 mg daily for depression and anxiety.  No refills sent at this time. -Continue Remeron  15 mg QHS for depression, andxiety, sleep, and appetite.  No refills sent at this time. -Continue Propanolol 20 mg TID for anxiety and HTN.  No refills sent at this time. -Continue Hydroxyzine  25 mg TID PRN for anxiety.  No refills sent at this time.    Marsa GORMAN Rosser, MD 08/17/2023

## 2023-08-31 ENCOUNTER — Telehealth: Payer: Self-pay | Admitting: Physician Assistant

## 2023-08-31 ENCOUNTER — Other Ambulatory Visit: Payer: Self-pay

## 2023-08-31 DIAGNOSIS — K7460 Unspecified cirrhosis of liver: Secondary | ICD-10-CM

## 2023-08-31 NOTE — Telephone Encounter (Signed)
Inbound call from patient returning phone  regarding recent imaging. Requesting a call back. Please advise, thank you.

## 2023-08-31 NOTE — Telephone Encounter (Signed)
 Spoke with the patient

## 2023-09-03 ENCOUNTER — Encounter: Payer: Self-pay | Admitting: *Deleted

## 2023-09-03 ENCOUNTER — Other Ambulatory Visit (HOSPITAL_COMMUNITY): Payer: Self-pay | Admitting: Student in an Organized Health Care Education/Training Program

## 2023-09-03 ENCOUNTER — Other Ambulatory Visit: Payer: Self-pay

## 2023-09-03 DIAGNOSIS — F410 Panic disorder [episodic paroxysmal anxiety] without agoraphobia: Secondary | ICD-10-CM

## 2023-09-03 DIAGNOSIS — F411 Generalized anxiety disorder: Secondary | ICD-10-CM

## 2023-09-03 DIAGNOSIS — F332 Major depressive disorder, recurrent severe without psychotic features: Secondary | ICD-10-CM

## 2023-09-03 MED ORDER — PROPRANOLOL HCL 20 MG PO TABS
20.0000 mg | ORAL_TABLET | Freq: Three times a day (TID) | ORAL | 1 refills | Status: DC
  Filled 2023-09-03: qty 90, 30d supply, fill #0
  Filled 2023-10-05: qty 90, 30d supply, fill #1

## 2023-09-03 MED ORDER — VENLAFAXINE HCL ER 37.5 MG PO CP24
37.5000 mg | ORAL_CAPSULE | Freq: Every day | ORAL | 1 refills | Status: DC
  Filled 2023-09-03: qty 30, 30d supply, fill #0

## 2023-09-03 NOTE — Telephone Encounter (Signed)
 Received message from patients pharmacy that patient needed refills of her medications.  These were sent in.   Sent: -Effexor XR 37.5 mg daily(to be taken with 75 mg capsule for total of 112.5 mg daily). 30 capsules with 1 refill.  -Propanolol 20 mg TID.  90 tablets with 1 refill.   Arna Snipe MD Resident

## 2023-09-04 ENCOUNTER — Other Ambulatory Visit: Payer: Self-pay

## 2023-09-07 ENCOUNTER — Other Ambulatory Visit: Payer: Self-pay

## 2023-09-07 ENCOUNTER — Other Ambulatory Visit (HOSPITAL_COMMUNITY): Payer: Self-pay | Admitting: Student in an Organized Health Care Education/Training Program

## 2023-09-07 ENCOUNTER — Ambulatory Visit (INDEPENDENT_AMBULATORY_CARE_PROVIDER_SITE_OTHER): Payer: Medicaid Other | Admitting: Student in an Organized Health Care Education/Training Program

## 2023-09-07 DIAGNOSIS — F411 Generalized anxiety disorder: Secondary | ICD-10-CM

## 2023-09-07 DIAGNOSIS — F410 Panic disorder [episodic paroxysmal anxiety] without agoraphobia: Secondary | ICD-10-CM

## 2023-09-07 DIAGNOSIS — F332 Major depressive disorder, recurrent severe without psychotic features: Secondary | ICD-10-CM

## 2023-09-07 MED ORDER — MIRTAZAPINE 15 MG PO TABS
15.0000 mg | ORAL_TABLET | Freq: Every day | ORAL | 1 refills | Status: DC
  Filled 2023-09-07 – 2023-09-13 (×2): qty 30, 30d supply, fill #0
  Filled 2023-10-11: qty 30, 30d supply, fill #1

## 2023-09-07 NOTE — Telephone Encounter (Signed)
 Received message from patient's pharmacy that she needed a refill of Remeron.  This was sent in.    Sent: -Remeron 15 mg QHS.  30 tablets with 1 refill.    Arna Snipe MD Resident

## 2023-09-07 NOTE — Progress Notes (Addendum)
 [facility] [address] [place] [address] Dept: [phone] Dept Fax: [phone]  Psychotherapy Progress Note  Patient ID: Sydney Mccoy, female  DOB: 1970/11/02, 54 y.o.  MRN: [id number]  09/07/2023 Start time: 8:04 AM End time: 8:49 AM  Method of Visit: Face-to-Face  Present: patient  Current Concerns:  Sydney Mccoy is a 53 yr old female who presents from [facility] for Follow Up, Medication Management and Therapy.  PPHx is significant for Depression, Anxiety, Bulimia, ADHD, and Auditory Processing Disorder, Suicidal Gestures (teenage years), 1 Hospitalization (age 53- [facility]), and no History of Self Injurious Behavior.    She reports she has been seeing improvements.  She reports that she has been calling [name] to the point where it is now the routine for her to call.  She reports that because of this her anxiety has become less and less and now she does not have any calling him.    She reports that there is 1 significant source of anxiety left.  She reports that she knows it comes from when her and Madelaine Bhat get back together.  She reports that it makes the relationship feel new because it is often a significant span of time between them being together.  She reports that Madelaine Bhat was not able to come up recently as planned because he was sick and then up having pneumonia.  She reports that she realized they have not gone somewhere just the 2 of them since before COVID.  Discussed with her the improvements that shows by her being able to identify the root and because of her anxiety.  Discussed with her that since she recognizes the pattern she then has the ability to change the pattern.  Recommended she be the 1 to plan a trip and eventually may be instead of [name] coming up to visit her she could go visit him in [place].  She also reports anxiety over her medical issues.  She reports she has stopped drinking which she knows will be  helpful but she is afraid of the damage that has been done.  She reports she has an endoscopy and possible liver biopsy scheduled which is concerning to her.  She does recognize the importance of having it done and will have it done.  Prior to leaving the appointment she confirmed she was in a stable and safe mindset.  She reports no SI, HI, or AVH.     Current Symptoms: Anxiety  Psychiatric Specialty Exam: General Appearance: Casual and Fairly Groomed  Eye Contact:  Good  Speech:  Clear and Coherent and Normal Rate  Volume:  Normal  Mood:   "Ok"  Affect:  Appropriate and Congruent  Thought Process:  Coherent and Goal Directed  Orientation:  Full (Time, Place, and Person)  Thought Content:  WDL and Logical  Suicidal Thoughts:  No  Homicidal Thoughts:  No  Memory:  Immediate;   Good Recent;   Good  Judgement:  Good  Insight:  Good  Psychomotor Activity:  Normal  Concentration:  Concentration: Good and Attention Span: Good  Recall:  Good  Fund of Knowledge:Good  Language: Good  Akathisia:  Negative  Handed:  Right  AIMS (if indicated):  not done  Assets:  Communication Skills Desire for Improvement Housing Resilience Social Support  ADL's:  Intact  Cognition: WNL  Sleep:  Good     Diagnosis: GAD with Panic Disorder, MDD, Recurrent, Severe, w/out Psychosis, EtOH Abuse   Anticipated Frequency of  Visits: every 2 weeks Anticipated Length of Treatment Episode: 20  Short Term Goals/Goals for Treatment Session: Continue calling [name].  Plan vacations with [name] Progress Towards Goals: Progressing Has been calling [name] and had reduced anxiety from it.  Treatment Intervention: Insight-oriented therapy and Supportive therapy  Medical Necessity: Assisted patient to achieve or maintain maximum functional capacity  Assessment Tools:    02/23/2023   10:51 AM 06/16/2022   10:15 AM 04/13/2022    9:39 AM  Depression screen PHQ 2/9  Decreased Interest 2 0 3  Down, Depressed,  Hopeless 1 0 3  PHQ - 2 Score 3 0 6  Altered sleeping 0 0 3  Tired, decreased energy 2 2 3   Change in appetite 3 0 3  Feeling bad or failure about yourself  2 2 3   Trouble concentrating 0 3 3  Moving slowly or fidgety/restless 0 0 3  Suicidal thoughts 0 0 0  PHQ-9 Score 10 7 24   Difficult doing work/chores  Somewhat difficult    Failed to redirect to the Timeline version of the REVFS SmartLink. Flowsheet Row ED from 01/16/2023 in [facility] Emergency Department at [facility] Most recent reading at 01/16/2023  4:10 PM ED from 01/16/2023 in [facility] Urgent Care at [facility] Most recent reading at 01/16/2023  2:53 PM  C-SSRS RISK CATEGORY No Risk No Risk       Collaboration of Care:   Patient/Guardian was advised Release of Information must be obtained prior to any record release in order to collaborate their care with an outside provider. Patient/Guardian was advised if they have not already done so to contact the registration department to sign all necessary forms in order for [place] to release information regarding their care.   Consent: Patient/Guardian gives verbal consent for treatment and assignment of benefits for services provided during this visit. Patient/Guardian expressed understanding and agreed to proceed.   Plan:  Sydney Mccoy is a 53 yr old female who presents from [facility] for Follow Up, Medication Management and Therapy.  PPHx is significant for Depression, Anxiety, Bulimia, ADHD, and Auditory Processing Disorder, Suicidal Gestures (teenage years), 1 Hospitalization (age 18- [facility]), and no History of Self Injurious Behavior.   Provided talk/supportive therapy.  She has been calling [name] and noticed the reduction in anxiety.  She has been able to identify the source of her anxiety and so will work on reducing this by scheduling vacation/getaways with Madelaine Bhat.  She does have anxiety over her medical issues but is addressing them.  Prior to leaving the appointment she  confirmed she was in a stable and safe mindset.  She reports no SI, HI, or AVH.     GAD  MDD:  -Continue Effexor XR 112.5 mg daily for depression and anxiety.  No refills sent at this time. -Continue Remeron 15 mg QHS for depression, andxiety, sleep, and appetite.  No refills sent at this time. -Continue Propanolol 20 mg TID for anxiety and HTN.  No refills sent at this time. -Continue Hydroxyzine 25 mg TID PRN for anxiety.  No refills sent at this time.    Lauro Franklin, MD 09/07/2023

## 2023-09-13 ENCOUNTER — Other Ambulatory Visit: Payer: Self-pay

## 2023-09-28 ENCOUNTER — Ambulatory Visit (INDEPENDENT_AMBULATORY_CARE_PROVIDER_SITE_OTHER): Payer: Medicaid Other | Admitting: Student in an Organized Health Care Education/Training Program

## 2023-09-28 ENCOUNTER — Other Ambulatory Visit: Payer: Self-pay

## 2023-09-28 ENCOUNTER — Encounter (HOSPITAL_COMMUNITY): Payer: Self-pay | Admitting: Student in an Organized Health Care Education/Training Program

## 2023-09-28 DIAGNOSIS — F410 Panic disorder [episodic paroxysmal anxiety] without agoraphobia: Secondary | ICD-10-CM

## 2023-09-28 DIAGNOSIS — K2950 Unspecified chronic gastritis without bleeding: Secondary | ICD-10-CM | POA: Diagnosis not present

## 2023-09-28 DIAGNOSIS — F411 Generalized anxiety disorder: Secondary | ICD-10-CM | POA: Diagnosis not present

## 2023-09-28 DIAGNOSIS — F332 Major depressive disorder, recurrent severe without psychotic features: Secondary | ICD-10-CM

## 2023-09-28 MED ORDER — VENLAFAXINE HCL ER 150 MG PO CP24
150.0000 mg | ORAL_CAPSULE | Freq: Every day | ORAL | 2 refills | Status: DC
Start: 2023-09-28 — End: 2023-12-20
  Filled 2023-09-28: qty 30, 30d supply, fill #0
  Filled 2023-10-24: qty 30, 30d supply, fill #1
  Filled 2023-11-22: qty 30, 30d supply, fill #2
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
## 2023-10-08 ENCOUNTER — Ambulatory Visit (AMBULATORY_SURGERY_CENTER): Payer: Medicaid Other | Admitting: Internal Medicine

## 2023-10-08 ENCOUNTER — Encounter: Payer: Self-pay | Admitting: Internal Medicine

## 2023-10-08 ENCOUNTER — Telehealth: Payer: Self-pay | Admitting: *Deleted

## 2023-10-08 VITALS — BP 126/96 | HR 77 | Temp 97.8°F | Resp 16 | Ht 66.5 in | Wt 193.0 lb

## 2023-10-08 DIAGNOSIS — K2970 Gastritis, unspecified, without bleeding: Secondary | ICD-10-CM | POA: Diagnosis not present

## 2023-10-08 DIAGNOSIS — K7460 Unspecified cirrhosis of liver: Secondary | ICD-10-CM

## 2023-10-08 DIAGNOSIS — K7469 Other cirrhosis of liver: Secondary | ICD-10-CM

## 2023-10-08 DIAGNOSIS — K2950 Unspecified chronic gastritis without bleeding: Secondary | ICD-10-CM

## 2023-10-08 DIAGNOSIS — F419 Anxiety disorder, unspecified: Secondary | ICD-10-CM | POA: Diagnosis not present

## 2023-10-08 DIAGNOSIS — J449 Chronic obstructive pulmonary disease, unspecified: Secondary | ICD-10-CM | POA: Diagnosis not present

## 2023-10-08 DIAGNOSIS — R768 Other specified abnormal immunological findings in serum: Secondary | ICD-10-CM

## 2023-10-08 MED ORDER — SODIUM CHLORIDE 0.9 % IV SOLN: 500.0000 mL | Freq: Once | INTRAVENOUS | Status: DC

## 2023-10-08 MED ORDER — OMEPRAZOLE 40 MG PO CPDR: 40.0000 mg | DELAYED_RELEASE_CAPSULE | Freq: Every day | ORAL | 1 refills | Status: DC

## 2023-10-08 NOTE — Progress Notes (Signed)
 Pt's states no medical or surgical changes since previsit or office visit.

## 2023-10-08 NOTE — Patient Instructions (Signed)
 Educational handout provided to patient related to Gastritis  Resume previous diet  Continue present medications  Awaiting pathology results   YOU HAD AN ENDOSCOPIC PROCEDURE TODAY AT THE [facility]:   Refer to the procedure report that was given to you for any specific questions about what was found during the examination.  If the procedure report does not answer your questions, please call your gastroenterologist to clarify.  If you requested that your care partner not be given the details of your procedure findings, then the procedure report has been included in a sealed envelope for you to review at your convenience later.  YOU SHOULD EXPECT: Some feelings of bloating in the abdomen. Passage of more gas than usual.  Walking can help get rid of the air that was put into your GI tract during the procedure and reduce the bloating. If you had a lower endoscopy (such as a colonoscopy or flexible sigmoidoscopy) you may notice spotting of blood in your stool or on the toilet paper. If you underwent a bowel prep for your procedure, you may not have a normal bowel movement for a few days.  Please Note:  You might notice some irritation and congestion in your nose or some drainage.  This is from the oxygen used during your procedure.  There is no need for concern and it should clear up in a day or so.  SYMPTOMS TO REPORT IMMEDIATELY:  Following upper endoscopy (EGD)  Vomiting of blood or coffee ground material  New chest pain or pain under the shoulder blades  Painful or persistently difficult swallowing  New shortness of breath  Fever of 100F or higher  Black, tarry-looking stools  For urgent or emergent issues, a gastroenterologist can be reached at any hour by calling [phone]. Do not use MyChart messaging for urgent concerns.    DIET:  We do recommend a small meal at first, but then you may proceed to your regular diet.  Drink plenty of fluids but you should avoid  alcoholic beverages for 24 hours.  ACTIVITY:  You should plan to take it easy for the rest of today and you should NOT DRIVE or use heavy machinery until tomorrow (because of the sedation medicines used during the test).    FOLLOW UP: Our staff will call the number listed on your records the next business day following your procedure.  We will call around 7:15- 8:00 am to check on you and address any questions or concerns that you may have regarding the information given to you following your procedure. If we do not reach you, we will leave a message.     If any biopsies were taken you will be contacted by phone or by letter within the next 1-3 weeks.  Please call us at [phone] if you have not heard about the biopsies in 3 weeks.    SIGNATURES/CONFIDENTIALITY: You and/or your care partner have signed paperwork which will be entered into your electronic medical record.  These signatures attest to the fact that that the information above on your After Visit Summary has been reviewed and is understood.  Full responsibility of the confidentiality of this discharge information lies with you and/or your care-partner.

## 2023-10-08 NOTE — Progress Notes (Signed)
 Called to room to assist during endoscopic procedure.  Patient ID and intended procedure confirmed with present staff. Received instructions for my participation in the procedure from the performing physician.

## 2023-10-08 NOTE — Progress Notes (Signed)
 Pt resting comfortably. VSS. Airway intact. SBAR complete to RN. All questions answered.

## 2023-10-08 NOTE — Progress Notes (Signed)
 GASTROENTEROLOGY PROCEDURE H&P NOTE   Primary Care Physician: Pcp, No    Reason for Procedure:  Cirrhosis to screen for varices  Plan:    EGD  Patient is appropriate for endoscopic procedure(s) in the ambulatory ([facility]) setting.  The nature of the procedure, as well as the risks, benefits, and alternatives were carefully and thoroughly reviewed with the patient. Ample time for discussion and questions allowed. The patient understood, was satisfied, and agreed to proceed.     HPI: Sydney Mccoy is a 53 y.o. female who presents for EGD.  Medical history as below.  Tolerated the prep.  No recent chest pain or shortness of breath.  No abdominal pain today.  Past Medical History:  Diagnosis Date   COPD (chronic obstructive pulmonary disease) (HCC)    Diverticulitis    Tachycardia     Past Surgical History:  Procedure Laterality Date   ABDOMINAL HERNIA REPAIR     INSERTION OF MESH     OOPHORECTOMY Left    PATELLA FRACTURE SURGERY Left     Prior to Admission medications   Medication Sig Start Date End Date Taking? Authorizing Provider  mirtazapine (REMERON) 15 MG tablet Take 1 tablet (15 mg total) by mouth at bedtime. 09/07/23  Yes Pashayan, Mardelle Matte, MD  propranolol (INDERAL) 20 MG tablet Take 1 tablet (20 mg total) by mouth 3 (three) times daily. 09/03/23  Yes Lauro Franklin, MD  venlafaxine XR (EFFEXOR XR) 150 MG 24 hr capsule Take 1 capsule (150 mg total) by mouth daily. 09/28/23  Yes Lauro Franklin, MD    Current Outpatient Medications  Medication Sig Dispense Refill   mirtazapine (REMERON) 15 MG tablet Take 1 tablet (15 mg total) by mouth at bedtime. 30 tablet 1   propranolol (INDERAL) 20 MG tablet Take 1 tablet (20 mg total) by mouth 3 (three) times daily. 90 tablet 1   venlafaxine XR (EFFEXOR XR) 150 MG 24 hr capsule Take 1 capsule (150 mg total) by mouth daily. 30 capsule 2   Current Facility-Administered Medications  Medication Dose Route  Frequency Provider Last Rate Last Admin   0.9 %  sodium chloride infusion  500 mL Intravenous Once Briannie Gutierrez, Carie Caddy, MD        Allergies as of 10/08/2023   (Not on File)    Family History  Problem Relation Age of Onset   Heart attack Father    Colon cancer Neg Hx    Esophageal cancer Neg Hx    Stomach cancer Neg Hx     Social History   Socioeconomic History   Marital status: Divorced    Spouse name: Not on file   Number of children: 1   Years of education: Not on file   Highest education level: Not on file  Occupational History   Occupation: N/A  Tobacco Use   Smoking status: Former    Types: Cigarettes   Smokeless tobacco: Never  Vaping Use   Vaping status: Never Used  Substance and Sexual Activity   Alcohol use: Yes    Alcohol/week: 28.0 standard drinks of alcohol    Types: 28 Standard drinks or equivalent per week    Comment: 4 beers on the weekend   Drug use: Yes    Comment: CBD oil   Sexual activity: Not on file  Other Topics Concern   Not on file  Social History Narrative   Not on file   Social Drivers of Health   Financial Resource Strain: Not  on file  Food Insecurity: Not on file  Transportation Needs: Not on file  Physical Activity: Not on file  Stress: Not on file  Social Connections: Not on file  Intimate Partner Violence: Not on file    Physical Exam: Vital signs in last 24 hours: @BP  129/85   Pulse 78   Temp 97.8 F (36.6 C) (Skin)   Ht 5' 6.5" (1.689 m)   Wt 193 lb (87.5 kg)   LMP 03/25/2014 (Approximate)   SpO2 97%   BMI 30.68 kg/m  GEN: NAD EYE: Sclerae anicteric ENT: MMM CV: Non-tachycardic Pulm: CTA b/l GI: Soft, NT/ND NEURO:  Alert & Oriented x 3   Erick Blinks, MD [facility]  10/08/2023 9:33 AM

## 2023-10-08 NOTE — Op Note (Signed)
 [facility] Patient Name: Sydney Mccoy Procedure Date: 10/08/2023 9:27 AM MRN: [id number] Endoscopist: Beverley Fiedler , MD, [id number] Age: 53 Referring MD:  Date of Birth: 11/03/1970 Gender: Female Account #: [id number] Procedure:                Upper GI endoscopy Indications:              Cirrhosis rule out esophageal varices Medicines:                Monitored Anesthesia Care Procedure:                Pre-Anesthesia Assessment:                           - Prior to the procedure, a History and Physical                            was performed, and patient medications and                            allergies were reviewed. The patient's tolerance of                            previous anesthesia was also reviewed. The risks                            and benefits of the procedure and the sedation                            options and risks were discussed with the patient.                            All questions were answered, and informed consent                            was obtained. Prior Anticoagulants: The patient has                            taken no anticoagulant or antiplatelet agents. ASA                            Grade Assessment: II - A patient with mild systemic                            disease. After reviewing the risks and benefits,                            the patient was deemed in satisfactory condition to                            undergo the procedure.                           After obtaining informed consent, the endoscope was  passed under direct vision. Throughout the                            procedure, the patient's blood pressure, pulse, and                            oxygen saturations were monitored continuously. The                            Olympus scope [id number] was introduced through the                            mouth, and advanced to the second part of duodenum.                            The upper GI  endoscopy was accomplished without                            difficulty. The patient tolerated the procedure                            well. Scope In: Scope Out: Findings:                 The examined esophagus was normal.                           Patchy mild inflammation characterized by erythema                            was found in the gastric antrum. Biopsies were                            taken with a cold forceps for Helicobacter pylori                            testing.                           The examined duodenum was normal. Complications:            No immediate complications. Estimated Blood Loss:     Estimated blood loss was minimal. Impression:               - Normal esophagus.                           - No varices.                           - Gastritis. Biopsied.                           - Normal examined duodenum. Recommendation:           - Patient has a contact number available for  emergencies. The signs and symptoms of potential                            delayed complications were discussed with the                            patient. Return to normal activities tomorrow.                            Written discharge instructions were provided to the                            patient.                           - Resume previous diet.                           - Continue present medications.                           - Await pathology results.                           - Keep follow-up appointment with me as scheduled                            in April.                           - Repeat upper endoscopy in 2 years for screening                            purposes. Beverley Fiedler, MD 10/08/2023 10:02:16 AM This report has been signed electronically.

## 2023-10-08 NOTE — Telephone Encounter (Signed)
-----   Message from Carie Caddy Pyrtle sent at 10/08/2023 11:54 AM EDT ----- Patient had EGD for variceal screening today  She has cirrhosis but of unclear etiology.  History of heavy alcohol use in the past but none recently.  She has a significantly positive ANA.  IgG is normal.  Please proceed with radiology assisted percutaneous liver biopsy to exclude autoimmune hepatitis  Patient aware of appointment and agreeable  She has follow-up with me later in April JMP

## 2023-10-08 NOTE — Telephone Encounter (Signed)
Order placed for liver biopsy.

## 2023-10-09 ENCOUNTER — Telehealth: Payer: Self-pay | Admitting: *Deleted

## 2023-10-09 NOTE — Telephone Encounter (Signed)
 Left message on f/u call

## 2023-10-10 ENCOUNTER — Other Ambulatory Visit: Payer: Self-pay

## 2023-10-10 LAB — SURGICAL PATHOLOGY

## 2023-10-11 ENCOUNTER — Encounter: Payer: Self-pay | Admitting: Internal Medicine

## 2023-10-12 ENCOUNTER — Other Ambulatory Visit: Payer: Self-pay

## 2023-10-19 ENCOUNTER — Encounter (HOSPITAL_COMMUNITY): Payer: Self-pay | Admitting: Student in an Organized Health Care Education/Training Program

## 2023-10-19 ENCOUNTER — Other Ambulatory Visit: Payer: Self-pay

## 2023-10-19 ENCOUNTER — Ambulatory Visit (INDEPENDENT_AMBULATORY_CARE_PROVIDER_SITE_OTHER): Admitting: Student in an Organized Health Care Education/Training Program

## 2023-10-19 DIAGNOSIS — F411 Generalized anxiety disorder: Secondary | ICD-10-CM

## 2023-10-19 DIAGNOSIS — F1010 Alcohol abuse, uncomplicated: Secondary | ICD-10-CM

## 2023-10-19 DIAGNOSIS — F332 Major depressive disorder, recurrent severe without psychotic features: Secondary | ICD-10-CM

## 2023-10-19 DIAGNOSIS — F410 Panic disorder [episodic paroxysmal anxiety] without agoraphobia: Secondary | ICD-10-CM | POA: Diagnosis not present

## 2023-10-19 MED ORDER — HYDROXYZINE HCL 25 MG PO TABS
25.0000 mg | ORAL_TABLET | Freq: Three times a day (TID) | ORAL | 1 refills | Status: AC | PRN
  Filled 2023-10-19: qty 30, 10d supply, fill #0
  Filled 2024-02-21: qty 30, 10d supply, fill #1

## 2023-10-19 NOTE — Progress Notes (Signed)
 [facility] [address] [place] [address] Dept: [phone] Dept Fax: [phone]  Psychotherapy Progress Note  Patient ID: Sydney Mccoy, female  DOB: 08/22/1970, 53 y.o.  MRN: [id number]  10/19/2023 Start time: 8:02 AM End time: 8:51 AM  Method of Visit: Face-to-Face  Present: patient  Current Concerns:  Sydney Mccoy is a 53 yr old female who presents from [facility] for Follow Up, Medication Management and Therapy.  PPHx is significant for Depression, Anxiety, Bulimia, ADHD, and Auditory Processing Disorder, Suicidal Gestures (teenage years), 1 Hospitalization (age 20- [facility]), and no History of Self Injurious Behavior.   She reports things have been okay since her last appointment.  She reports that she did have the endoscopy which did not have any significant findings and it which she is relieved by.  She reports that the week after she was physically drained but despite that was still mentally in a good place.  She reports this past week has been a lot better and she has had more energy.  She reports she does have some anxiety over her upcoming liver biopsy but that she will deal with it as it comes.  She reports she does have some anxiety over [name] coming to visit but that she has been looking up things for them to do as they have not gone on a trip of their own for a while.  She does report being anxiety over [name] calling her.  She reports this has been troubling for her as they have always had a close relationship and she has never had this issue previously.  Discussed with her how she responded to this same issue by being the 1 to call [name] and encouraged her to do the same with [name] and she thinks this will be a good idea and be helpful.  She reports she has not yet created a schedule but has created an outline.  She reports having anxiety of putting pressure on herself to do things by making a schedule.  Encouraged her  to put something fun or enjoyable for herself as the first thing of the day as this would help overcome that anxiety.  She reports she is continuing to get significant enjoyment out of painting rocks and encouraged her to consider putting this as the first thing she does in the morning.  She reports she has made some big realizations since stopping drinking.  She reports she thought that when she stopped drinking everything would be 100% better but she knows now it is not.  She reports understanding that things overall are better but the issues that she was covering with her drinking are still there.  She reports she realizes she is no longer putting her life on hold for drinking like she had been.  She reports overall she is doing much better since stopping drinking.  Discussed with her she is continuing to make progress and has put tremendous effort into processing the significant traumas from her past which she does not give herself credit for doing.  Prior to leaving the appointment she confirmed she was in a stable and safe mindset.  She reports no SI, HI, or AVH.     Current Symptoms: Anxiety  Psychiatric Specialty Exam: General Appearance: Casual and Fairly Groomed  Eye Contact:  Good  Speech:  Clear and Coherent and Normal Rate  Volume:  Normal  Mood:   "Ok"  Affect:  Appropriate and Congruent  Thought Process:  Coherent and Goal Directed  Orientation:  Full (Time, Place, and Person)  Thought Content:  WDL and Logical  Suicidal Thoughts:  No  Homicidal Thoughts:  No  Memory:  Immediate;   Good Recent;   Good  Judgement:  Good  Insight:  Good  Psychomotor Activity:  Normal  Concentration:  Concentration: Good and Attention Span: Good  Recall:  Good  Fund of Knowledge:Good  Language: Good  Akathisia:  Negative  Handed:  Right  AIMS (if indicated):  not done  Assets:  Communication Skills Desire for Improvement Housing Resilience Social Support  ADL's:  Intact   Cognition: WNL  Sleep:  Good     Diagnosis: GAD with Panic Disorder, MDD, Recurrent, Severe, w/out Psychosis, EtOH Abuse   Anticipated Frequency of Visits: every 2 weeks Anticipated Length of Treatment Episode: 20  Short Term Goals/Goals for Treatment Session: Creat a schedule, call Alex Progress Towards Goals: Initial   Treatment Intervention: Insight-oriented therapy and Supportive therapy  Medical Necessity: Assisted patient to achieve or maintain maximum functional capacity  Assessment Tools:    02/23/2023   10:51 AM 06/16/2022   10:15 AM 04/13/2022    9:39 AM  Depression screen PHQ 2/9  Decreased Interest 2 0 3  Down, Depressed, Hopeless 1 0 3  PHQ - 2 Score 3 0 6  Altered sleeping 0 0 3  Tired, decreased energy 2 2 3   Change in appetite 3 0 3  Feeling bad or failure about yourself  2 2 3   Trouble concentrating 0 3 3  Moving slowly or fidgety/restless 0 0 3  Suicidal thoughts 0 0 0  PHQ-9 Score 10 7 24   Difficult doing work/chores  Somewhat difficult    Failed to redirect to the Timeline version of the REVFS SmartLink. Flowsheet Row ED from 01/16/2023 in [facility] Emergency Department at [facility] Most recent reading at 01/16/2023  4:10 PM ED from 01/16/2023 in [facility] at [facility] Most recent reading at 01/16/2023  2:53 PM  C-SSRS RISK CATEGORY No Risk No Risk       Collaboration of Care:   Patient/Guardian was advised Release of Information must be obtained prior to any record release in order to collaborate their care with an outside provider. Patient/Guardian was advised if they have not already done so to contact the registration department to sign all necessary forms in order for [place] to release information regarding their care.   Consent: Patient/Guardian gives verbal consent for treatment and assignment of benefits for services provided during this visit. Patient/Guardian expressed understanding and agreed to proceed.   Plan:   Sydney Mccoy is a 53 yr old female who presents from [facility] for Follow Up, Medication Management and Therapy.  PPHx is significant for Depression, Anxiety, Bulimia, ADHD, and Auditory Processing Disorder, Suicidal Gestures (teenage years), 1 Hospitalization (age 73- [facility]), and no History of Self Injurious Behavior.   Provided talk/supportive therapy.  She reports having ongoing physical issues which are being worked up but that mentally she is doing better.  To address her anxiety with [name] calling she will begin calling him.  She will work on getting a schedule made.  As her hydroxyzine was removed from her medication list this was readded and refill sent in today.  Prior to leaving the appointment she confirmed she was in a stable and safe mindset.  She reports no SI, HI, or AVH.   GAD  MDD:  -Continue Effexor XR 150 mg daily for  depression and anxiety.  No refills sent at this time. -Continue Remeron 15 mg QHS for depression, andxiety, sleep, and appetite.  No refills sent at this time. -Continue Propanolol 20 mg TID for anxiety and HTN.  No refills sent at this time. -Continue Hydroxyzine 25 mg TID PRN for anxiety.  30 tablets with 1 refill.    Lauro Franklin, MD 10/19/2023

## 2023-10-25 ENCOUNTER — Other Ambulatory Visit: Payer: Self-pay

## 2023-10-30 ENCOUNTER — Ambulatory Visit (INDEPENDENT_AMBULATORY_CARE_PROVIDER_SITE_OTHER): Payer: Medicaid Other | Admitting: Internal Medicine

## 2023-10-30 ENCOUNTER — Encounter: Payer: Self-pay | Admitting: Internal Medicine

## 2023-10-30 VITALS — BP 120/76 | HR 73 | Ht 66.5 in | Wt 199.0 lb

## 2023-10-30 DIAGNOSIS — K8020 Calculus of gallbladder without cholecystitis without obstruction: Secondary | ICD-10-CM

## 2023-10-30 DIAGNOSIS — R768 Other specified abnormal immunological findings in serum: Secondary | ICD-10-CM

## 2023-10-30 DIAGNOSIS — Z860100 Personal history of colon polyps, unspecified: Secondary | ICD-10-CM

## 2023-10-30 DIAGNOSIS — F1021 Alcohol dependence, in remission: Secondary | ICD-10-CM

## 2023-10-30 DIAGNOSIS — Z7689 Persons encountering health services in other specified circumstances: Secondary | ICD-10-CM

## 2023-10-30 DIAGNOSIS — K760 Fatty (change of) liver, not elsewhere classified: Secondary | ICD-10-CM | POA: Diagnosis not present

## 2023-10-30 DIAGNOSIS — Z8719 Personal history of other diseases of the digestive system: Secondary | ICD-10-CM

## 2023-10-30 DIAGNOSIS — R772 Abnormality of alphafetoprotein: Secondary | ICD-10-CM

## 2023-10-30 DIAGNOSIS — K7460 Unspecified cirrhosis of liver: Secondary | ICD-10-CM | POA: Diagnosis not present

## 2023-10-30 DIAGNOSIS — F419 Anxiety disorder, unspecified: Secondary | ICD-10-CM

## 2023-10-30 MED ORDER — NA SULFATE-K SULFATE-MG SULF 17.5-3.13-1.6 GM/177ML PO SOLN: 1.0000 | Freq: Once | ORAL | 0 refills | Status: AC

## 2023-10-30 NOTE — Progress Notes (Signed)
 Subjective:    Patient ID: Sydney Mccoy, female    DOB: 27-May-1971, 53 y.o.   MRN: [id number]  HPI Mekaela Azizi is a 53 year old female with a new diagnosis of cirrhosis, felt most likely to be alcohol possibly overlapping with metabolic but also with the need to exclude autoimmune, history of complicated diverticulitis requiring resection in 2006 and 2007, history of anxiety and depression who is here for follow-up.  She is here alone today.  She initially saw Alfonza Iles, PA-C on 07/17/2023 and then for me for screening upper endoscopy on 10/08/2023.  Cirrhosis was initially suspected on imaging and diagnosed on July 17, 2023. An MRI of the abdomen on August 09, 2023, confirmed cirrhosis and hepatic steatosis, with no focal liver lesions or suspicious enhancement. Gallstones were noted without thickening or biliary ductal dilatation, and there were prominent porta hepatis lymph nodes likely reactive in nature, along with small volume loculated perihepatic ascites.  She has a history of complicated diverticulitis with perforation in 2006, which required resection and temporary colostomy, reversed in 2007. She also underwent incisional hernia repair with mesh in 2007.  An endoscopy on October 08, 2023, showed a normal esophagus and patchy erythema in the gastric antrum, which was biopsied and showed mild chronic inactive gastritis without H. Pylori.  Her lab work from January revealed a positive ANA at 1:1280, with normal IgG at 1588, normal ferritin at 261, negative mitochondrial antibody, and negative anti-smooth muscle antibody. AFP was slightly elevated at 9.  She has a history of alcohol use, which began after a complicated medical history in 2006. She describes periods of heavy alcohol use, particularly during times of increased anxiety, which is common in her family. She has recently sought behavioral health support to address her anxiety and alcohol use. She is currently on a 'no  drink' regimen due to her liver condition, although she occasionally has a glass of wine when dining out.  She estimates this is once a month.  Current medications include omeprazole  40 mg daily, propranolol  one tablet TID, venlafaxine  150 mg daily, mirtazapine  15 mg at bedtime, and Atarax  as needed.  All of these medications are prescribed for her anxiety by her mental health provider.  It should be noted that propranolol  was not started for portal hypertension purposes.  Her son is a current Holiday representative at [employer].   Review of Systems As per HPI, otherwise negative  Current Medications, Allergies, Past Medical History, Past Surgical History, Family History and Social History were reviewed in [employer] record.    Objective:   Physical Exam BP 120/76   Pulse 73   Ht 5' 6.5" (1.689 m)   Wt 199 lb (90.3 kg)   LMP 03/25/2014 (Approximate)   BMI 31.64 kg/m  Gen: awake, alert, NAD HEENT: anicteric  Ext: no c/c/e Neuro: nonfocal  LABS ANA: 1:1280 (07/2023) IgG: 1588 (07/2023) Ferritin: 261 (07/2023) AFP: 9.0 (07/2023) Mitochondrial antibody: negative (07/2023) Anti-smooth muscle antibody: negative (07/2023)  RADIOLOGY Abdominal MRI: Cirrhosis and hepatic steatosis with no focal liver lesions or suspicious enhancement. Gallstones without thickening or biliary ductal dilatation. Prominent porta hepatis lymph nodes likely reactive in nature and small volume loculated perihepatic ascites. (08/09/2023)  DIAGNOSTIC Endoscopy: Normal esophagus and patchy erythema in the gastric antrum. Biopsy showed mild chronic inactive gastritis without H. pylori. (10/08/2023)      Assessment & Plan:  53 year old female with a new diagnosis of cirrhosis, felt most likely to be alcohol possibly overlapping with  metabolic but also with the need to exclude autoimmune, history of complicated diverticulitis requiring resection in 2006 and 2007, history of anxiety and  depression who is here for follow-up.   Cirrhosis of liver Cirrhosis confirmed by MRI with hepatic steatosis. Etiology likely alcohol use and metabolic fatty liver disease. ANA positive, but other autoimmune markers negative. No significant portal hypertension or varices. AFP slightly elevated, no tumor detected by MRI. Liver biopsy scheduled to assess inflammation source and specifically include AIH. - Perform liver biopsy on Nov 14, 2023. - Order MRI in July 2025 to monitor liver status (6 months after last given elevated AFP; if unremarkable then resume annual cross-sectional imaging for [facility] screening). - Refer to primary care for metabolic management. - We discussed the importance of strict alcohol avoidance  Fatty liver disease Suspected as a contributing factor to cirrhosis. Awaiting biopsy results to confirm extent. Discussed potential future use of Ozempic or Mounjaro once FDA approved for the same. - Refer to primary care for metabolic management. - Consider future use of Ozempic or Mounjaro once FDA approved.  Alcohol use disorder Heavy alcohol use contributing to liver damage. Currently abstinent, engaging in therapy. Prefers non-AA support. Emphasized importance of abstinence. - Continue abstinence from alcohol. - Discuss relapse prevention with behavioral health. - Refer to primary care for ongoing support.  Gallstones Gallstones noted on MRI, potentially causing pain. No biliary ductal issues observed. - Consider further evaluation if pain persists on a regular basis.  Diverticulitis with perforation in 2006/reported history of colonic polyps on colonoscopy remotely with Dr. Nickey Barn Complicated diverticulitis with perforation in 2006, requiring resection. No recent complications. Risk of recurrence exceedingly rare. - Schedule colonoscopy to screen for polyps and reassess diverticular disease.  Anxiety disorder Contributing to past alcohol use. Managed with therapy and  propranolol . - Continue behavioral health therapy. - Continue propranolol  as prescribed.  Follow-up Plans for ongoing management and evaluation of liver and gastrointestinal health. - Schedule follow-up after liver biopsy results. - Coordinate with primary care for comprehensive management. - Ensure MRI is scheduled for July 2025.

## 2023-10-30 NOTE — Patient Instructions (Signed)
 We have referred you to get established with a new primary care physician. They will contact you directly with an appointment.   We will contact you in July to schedule your repeat MRI.  Please abstain from alcohol.   You have been scheduled for a colonoscopy. Please follow written instructions given to you at your visit today.   If you use inhalers (even only as needed), please bring them with you on the day of your procedure.  DO NOT TAKE 7 DAYS PRIOR TO TEST- Trulicity (dulaglutide) Ozempic, Wegovy (semaglutide) Mounjaro (tirzepatide) Bydureon Bcise (exanatide extended release)  DO NOT TAKE 1 DAY PRIOR TO YOUR TEST Rybelsus (semaglutide) Adlyxin (lixisenatide) Victoza (liraglutide) Byetta (exanatide) ___________________________________________________________________________    If your blood pressure at your visit was 140/90 or greater, please contact your primary care physician to follow up on this.  _______________________________________________________  If you are age 53 or older, your body mass index should be between 23-30. Your Body mass index is 31.64 kg/m. If this is out of the aforementioned range listed, please consider follow up with your Primary Care Provider.  If you are age 38 or younger, your body mass index should be between 19-25. Your Body mass index is 31.64 kg/m. If this is out of the aformentioned range listed, please consider follow up with your Primary Care Provider.   ________________________________________________________  The [facility] providers would like to encourage you to use MYCHART to communicate with providers for non-urgent requests or questions.  Due to long hold times on the telephone, sending your provider a message by [facility] may be a faster and more efficient way to get a response.  Please allow 48 business hours for a response.  Please remember that this is for non-urgent requests.   _______________________________________________________

## 2023-11-02 ENCOUNTER — Encounter (HOSPITAL_COMMUNITY): Payer: Self-pay | Admitting: Student in an Organized Health Care Education/Training Program

## 2023-11-02 ENCOUNTER — Ambulatory Visit (INDEPENDENT_AMBULATORY_CARE_PROVIDER_SITE_OTHER): Admitting: Student in an Organized Health Care Education/Training Program

## 2023-11-02 DIAGNOSIS — F411 Generalized anxiety disorder: Secondary | ICD-10-CM | POA: Diagnosis not present

## 2023-11-02 DIAGNOSIS — F410 Panic disorder [episodic paroxysmal anxiety] without agoraphobia: Secondary | ICD-10-CM

## 2023-11-02 DIAGNOSIS — F332 Major depressive disorder, recurrent severe without psychotic features: Secondary | ICD-10-CM

## 2023-11-02 NOTE — Progress Notes (Signed)
 BEHAVIORAL HEALTH HOSPITAL [facility] [address] [place] [address] Dept: [phone] Dept Fax: [phone]  Psychotherapy Progress Note  Patient ID: Sydney Mccoy, female  DOB: 1970/08/05, 53 y.o.  MRN: [id number]  11/02/2023 Start time: 10:28 AM End time: 11:16 AM  Method of Visit: Face-to-Face  Present: patient  Current Concerns:  Sydney Mccoy is a 53 yr old female who presents from [facility] for Follow Up, Medication Management and Therapy.  PPHx is significant for Depression, Anxiety, Bulimia, ADHD, and Auditory Processing Disorder, Suicidal Gestures (teenage years), 1 Hospitalization (age 79- [facility]), and no History of Self Injurious Behavior.   She reports she is feeling good right now without any anxiety.  She reports that Sydney Mccoy came down and has not had any anxiety with this.  She reports this is the first time in a wild that she can remember not having any anxiety.  She reports that she is excited for the upcoming trip to [place].  She reports specifically she is excited to be able to see it through her mother's eyes because this means so much to her mother.  She reports that her endoscopy was done and did not show any varices.  She reports she still has to undergo the liver biopsy and does have a routine colonoscopy scheduled.  She reports her doctor did encourage her to attend a support group.  She reports she is not interested in AA but did wonder if there might be other options available to her.  Discussed we would inquire about this and get back to her.  When asked about a schedule she reports she is still working on it but that she has started to implement the schedule.  She reports waking up and going to sleep at the same time every night.  She reports that she is eating meals at scheduled times.  She reports the result of this has been she is being more productive around the house which has been good.  When asked about her exercise plan she  reports she has not started it yet but does plan to start this soon.  Prior to leaving the appointment she confirmed she was in a stable and safe mindset.  She reports no SI, HI, or AVH.    Current Symptoms: Anxiety "over not having anxiety"  Psychiatric Specialty Exam: General Appearance: Casual and Fairly Groomed  Eye Contact:  Good  Speech:  Clear and Coherent and Normal Rate  Volume:  Normal  Mood:   "Ok"  Affect:  Appropriate and Congruent  Thought Process:  Coherent and Goal Directed  Orientation:  Full (Time, Place, and Person)  Thought Content:  WDL and Logical  Suicidal Thoughts:  No  Homicidal Thoughts:  No  Memory:  Immediate;   Good Recent;   Good  Judgement:  Good  Insight:  Good  Psychomotor Activity:  Normal  Concentration:  Concentration: Good and Attention Span: Good  Recall:  Good  Fund of Knowledge:Good  Language: Good  Akathisia:  Negative  Handed:  Right  AIMS (if indicated):  not done  Assets:  Communication Skills Desire for Improvement Housing Resilience Social Support  ADL's:  Intact  Cognition: WNL  Sleep:  Good     Diagnosis: GAD with Panic Disorder, MDD, Recurrent, Severe, w/out Psychosis, EtOH Abuse   Anticipated Frequency of Visits: every 2 weeks Anticipated Length of Treatment Episode: 20  Short Term Goals/Goals for Treatment Session: Continue working on schedule Progress Towards Goals: Progressing as  evidenced by her scheduling meals, sleep/wake times.   Treatment Intervention: Insight-oriented therapy and Supportive therapy  Medical Necessity: Assisted patient to achieve or maintain maximum functional capacity  Assessment Tools:    02/23/2023   10:51 AM 06/16/2022   10:15 AM 04/13/2022    9:39 AM  Depression screen PHQ 2/9  Decreased Interest 2 0 3  Down, Depressed, Hopeless 1 0 3  PHQ - 2 Score 3 0 6  Altered sleeping 0 0 3  Tired, decreased energy 2 2 3   Change in appetite 3 0 3  Feeling bad or failure about yourself   2 2 3   Trouble concentrating 0 3 3  Moving slowly or fidgety/restless 0 0 3  Suicidal thoughts 0 0 0  PHQ-9 Score 10 7 24   Difficult doing work/chores  Somewhat difficult    Failed to redirect to the Timeline version of the REVFS SmartLink. Flowsheet Row ED from 01/16/2023 in [facility] Emergency Department at [facility] Most recent reading at 01/16/2023  4:10 PM ED from 01/16/2023 in [facility] Urgent Care at [facility] Most recent reading at 01/16/2023  2:53 PM  C-SSRS RISK CATEGORY No Risk No Risk       Collaboration of Care:   Patient/Guardian was advised Release of Information must be obtained prior to any record release in order to collaborate their care with an outside provider. Patient/Guardian was advised if they have not already done so to contact the registration department to sign all necessary forms in order for us  to release information regarding their care.   Consent: Patient/Guardian gives verbal consent for treatment and assignment of benefits for services provided during this visit. Patient/Guardian expressed understanding and agreed to proceed.   Plan:  Sydney Mccoy is a 53 yr old female who presents from [facility] for Follow Up, Medication Management and Therapy.  PPHx is significant for Depression, Anxiety, Bulimia, ADHD, and Auditory Processing Disorder, Suicidal Gestures (teenage years), 1 Hospitalization (age 68- [facility]), and no History of Self Injurious Behavior.   Provided talk/supportive therapy.  She reports being anxiety free and that this is a new thing for her but she does feel good.  She has begun to implement a schedule which has been helpful.  She plans to start an exercise routine in the upcoming weeks.  Prior to leaving the appointment she confirmed she was in a stable and safe mindset.  She reports no SI, HI, or AVH.    GAD  MDD:  -Continue Effexor  XR 150 mg daily for depression and anxiety.  No refills sent at this time. -Continue Remeron  15  mg QHS for depression, andxiety, sleep, and appetite.  No refills sent at this time. -Continue Propanolol 20 mg TID for anxiety and HTN.  No refills sent at this time. -Continue Hydroxyzine  25 mg TID PRN for anxiety.  No refills sent at this time.    Basilia Bosworth, MD 11/02/2023

## 2023-11-05 ENCOUNTER — Ambulatory Visit (HOSPITAL_COMMUNITY)

## 2023-11-06 ENCOUNTER — Other Ambulatory Visit (HOSPITAL_COMMUNITY): Payer: Self-pay | Admitting: Student in an Organized Health Care Education/Training Program

## 2023-11-06 DIAGNOSIS — F410 Panic disorder [episodic paroxysmal anxiety] without agoraphobia: Secondary | ICD-10-CM

## 2023-11-06 DIAGNOSIS — F332 Major depressive disorder, recurrent severe without psychotic features: Secondary | ICD-10-CM

## 2023-11-06 DIAGNOSIS — F411 Generalized anxiety disorder: Secondary | ICD-10-CM

## 2023-11-08 ENCOUNTER — Other Ambulatory Visit: Payer: Self-pay

## 2023-11-08 MED ORDER — PROPRANOLOL HCL 20 MG PO TABS
20.0000 mg | ORAL_TABLET | Freq: Three times a day (TID) | ORAL | 1 refills | Status: DC
  Filled 2023-11-08: qty 90, 30d supply, fill #0
  Filled 2023-12-04: qty 90, 30d supply, fill #1

## 2023-11-08 MED ORDER — MIRTAZAPINE 15 MG PO TABS
15.0000 mg | ORAL_TABLET | Freq: Every day | ORAL | 1 refills | Status: DC
Start: 2023-11-08 — End: 2024-01-01
  Filled 2023-11-08: qty 30, 30d supply, fill #0
  Filled 2023-12-04: qty 30, 30d supply, fill #1

## 2023-11-08 NOTE — Telephone Encounter (Addendum)
 Received request from patient's pharmacy for refill of her propranolol  and Remeron .  These were sent in.   Sent: -Remeron  15 mg QHS for depression, andxiety, sleep, and appetite.  30 tablets with 1 refill. -Propanolol 20 mg TID for anxiety and HTN.  90 tablets with 1 refill.

## 2023-11-09 ENCOUNTER — Ambulatory Visit (INDEPENDENT_AMBULATORY_CARE_PROVIDER_SITE_OTHER): Admitting: Student in an Organized Health Care Education/Training Program

## 2023-11-09 ENCOUNTER — Encounter (HOSPITAL_COMMUNITY): Payer: Self-pay | Admitting: Student in an Organized Health Care Education/Training Program

## 2023-11-09 DIAGNOSIS — F332 Major depressive disorder, recurrent severe without psychotic features: Secondary | ICD-10-CM | POA: Diagnosis not present

## 2023-11-09 DIAGNOSIS — F411 Generalized anxiety disorder: Secondary | ICD-10-CM

## 2023-11-09 NOTE — Progress Notes (Signed)
 [facility] [address] [place] [address] Dept: [phone] Dept Fax: [phone]  Psychotherapy Progress Note  Patient ID: Sydney Mccoy, female  DOB: [birth year], 53 y.o.  MRN: [id number]  11/09/2023 Start time: 8:05 AM End time: 8:35 AM  Method of Visit: Face-to-Face  Present: patient  Current Concerns:  Sydney Mccoy is a 53 yr old female who presents from [facility] for Follow Up, Medication Management and Therapy.  PPHx is significant for Depression, Anxiety, Bulimia, ADHD, and Auditory Processing Disorder, Suicidal Gestures (teenage years), 1 Hospitalization (age 63- [facility]), and no History of Self Injurious Behavior.   She reports there have been some major decisions made since her last appointment.  She reports that Sydney Mccoy was offered a new job that would most likely lead to him moving to [place] .  She reports that he called her and they discussed it and he has taken the job.  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  She reports that she did feel really good about the decision the day she made it and then had some anxiety the next day but then the day after that improved again.  She reports that this is a big upcoming change but it seems like things are moving forward.  She reports she has been continuing to work on her schedule but has not been able to exercise as she did throw out her back and so has had to stay on a heating pad the last few days.  She reports that This Weekend Her and Her Mom Are Going to [employer] to cat sit as he and his girlfriend are going to a concert in [place].  She reports she is looking forward to this as it will be a nice staycation for them.  She reports that her plan is on Monday to start exercising.  She reports she is a little nervous  about the liver biopsy next week but knows to be to be done.  She reports she feels like she never makes any decisions and is passive.  Discussed with her the major decisions she recently made and has been making.  Prior to leaving the appointment she confirmed she was in a stable and safe mindset.  She reports no SI, HI, or AVH.     Current Symptoms: Anxiety   Psychiatric Specialty Exam: General Appearance: Casual and Fairly Groomed  Eye Contact:  Good  Speech:  Clear and Coherent and Normal Rate  Volume:  Normal  Mood:   "Ok"  Affect:  Appropriate and Congruent  Thought Process:  Coherent and Goal Directed  Orientation:  Full (Time, Place, and Person)  Thought Content:  WDL and Logical  Suicidal Thoughts:  No  Homicidal Thoughts:  No  Memory:  Immediate;   Good Recent;   Good  Judgement:  Good  Insight:  Good  Psychomotor Activity:  Normal  Concentration:  Concentration: Good and Attention Span: Good  Recall:  Good  Fund of Knowledge:Good  Language: Good  Akathisia:  Negative  Handed:  Right  AIMS (if indicated):  not done  Assets:  Communication Skills Desire for Improvement Housing Resilience Social Support  ADL's:  Intact  Cognition: WNL  Sleep:  Good     Diagnosis: GAD with Panic Disorder, MDD, Recurrent, Severe, w/out  Psychosis, EtOH Abuse   Anticipated Frequency of Visits: every 2 weeks Anticipated Length of Treatment Episode: 20  Short Term Goals/Goals for Treatment Session: Continue working on schedule Progress Towards Goals: Progressing but still has work to do as she has not started exercising  Treatment Intervention: Insight-oriented therapy and Supportive therapy  Medical Necessity: Assisted patient to achieve or maintain maximum functional capacity  Assessment Tools:    02/23/2023   10:51 AM 06/16/2022   10:15 AM 04/13/2022    9:39 AM  Depression screen PHQ 2/9  Decreased Interest 2 0 3  Down, Depressed, Hopeless 1 0 3  PHQ - 2 Score 3 0  6  Altered sleeping 0 0 3  Tired, decreased energy 2 2 3   Change in appetite 3 0 3  Feeling bad or failure about yourself  2 2 3   Trouble concentrating 0 3 3  Moving slowly or fidgety/restless 0 0 3  Suicidal thoughts 0 0 0  PHQ-9 Score 10 7 24   Difficult doing work/chores  Somewhat difficult    Failed to redirect to the Timeline version of the REVFS SmartLink. Flowsheet Row ED from 01/16/2023 in [facility] at [facility] Most recent reading at 01/16/2023  4:10 PM ED from 01/16/2023 in [facility] at [facility] Most recent reading at 01/16/2023  2:53 PM  C-SSRS RISK CATEGORY No Risk No Risk       Collaboration of Care:   Patient/Guardian was advised Release of Information must be obtained prior to any record release in order to collaborate their care with an outside provider. Patient/Guardian was advised if they have not already done so to contact the registration department to sign all necessary forms in order for us  to release information regarding their care.   Consent: Patient/Guardian gives verbal consent for treatment and assignment of benefits for services provided during this visit. Patient/Guardian expressed understanding and agreed to proceed.   Plan:  Sydney Mccoy is a 53 yr old female who presents from [facility] for Follow Up, Medication Management and Therapy.  PPHx is significant for Depression, Anxiety, Bulimia, ADHD, and Auditory Processing Disorder, Suicidal Gestures (teenage years), 1 Hospitalization (age 31- [facility]), and no History of Self Injurious Behavior.   Provided talk/supportive therapy.  She reports she is overall doing better.  She continues to have trouble getting her self credit for the work she has been doing but is starting to do so- specifically making decisions and not being passive.  We will continue to work on this.  Prior to leaving the appointment she confirmed she was in a stable and safe mindset.  She reports no  SI, HI, or AVH.     GAD  MDD:  -Continue Effexor  XR 150 mg daily for depression and anxiety.  No refills sent at this time. -Continue Remeron  15 mg QHS for depression, andxiety, sleep, and appetite.  No refills sent at this time. -Continue Propanolol 20 mg TID for anxiety and HTN.  No refills sent at this time. -Continue Hydroxyzine  25 mg TID PRN for anxiety.  No refills sent at this time.    Basilia Bosworth, MD 11/09/2023

## 2023-11-14 ENCOUNTER — Other Ambulatory Visit: Payer: Self-pay | Admitting: Radiology

## 2023-11-14 ENCOUNTER — Ambulatory Visit (HOSPITAL_COMMUNITY)
Admission: RE | Admit: 2023-11-14 | Discharge: 2023-11-14 | Disposition: A | Discharge status: Home / Self Care | Source: Ambulatory Visit | Attending: Internal Medicine | Admitting: Internal Medicine

## 2023-11-14 ENCOUNTER — Other Ambulatory Visit: Payer: Self-pay

## 2023-11-14 ENCOUNTER — Encounter (HOSPITAL_COMMUNITY): Payer: Self-pay

## 2023-11-14 DIAGNOSIS — K7460 Unspecified cirrhosis of liver: Secondary | ICD-10-CM

## 2023-11-14 DIAGNOSIS — K7581 Nonalcoholic steatohepatitis (NASH): Secondary | ICD-10-CM | POA: Diagnosis not present

## 2023-11-14 DIAGNOSIS — K760 Fatty (change of) liver, not elsewhere classified: Secondary | ICD-10-CM | POA: Insufficient documentation

## 2023-11-14 DIAGNOSIS — K769 Liver disease, unspecified: Secondary | ICD-10-CM | POA: Diagnosis not present

## 2023-11-14 DIAGNOSIS — R768 Other specified abnormal immunological findings in serum: Secondary | ICD-10-CM

## 2023-11-14 LAB — PROTIME-INR
INR: 1.1 (ref 0.8–1.2)
Prothrombin Time: 14.8 s (ref 11.4–15.2)

## 2023-11-14 LAB — CBC
HCT: 38.5 % (ref 36.0–46.0)
Hemoglobin: 12.9 g/dL (ref 12.0–15.0)
MCH: 30.7 pg (ref 26.0–34.0)
MCHC: 33.5 g/dL (ref 30.0–36.0)
MCV: 91.7 fL (ref 80.0–100.0)
Platelets: 123 10*3/uL — ABNORMAL LOW (ref 150–400)
RBC: 4.2 MIL/uL (ref 3.87–5.11)
RDW: 13.7 % (ref 11.5–15.5)
WBC: 4.1 10*3/uL (ref 4.0–10.5)
nRBC: 0 % (ref 0.0–0.2)

## 2023-11-14 MED ORDER — DIPHENHYDRAMINE HCL 50 MG/ML IJ SOLN
INTRAMUSCULAR | Status: AC | PRN
  Administered 2023-11-14: 25 mg via INTRAVENOUS

## 2023-11-14 MED ORDER — LIDOCAINE HCL (PF) 1 % IJ SOLN
INTRAMUSCULAR | Status: AC
  Filled 2023-11-14: qty 30

## 2023-11-14 MED ORDER — MIDAZOLAM HCL 2 MG/2ML IJ SOLN
INTRAMUSCULAR | Status: AC | PRN
  Administered 2023-11-14: 1 mg via INTRAVENOUS

## 2023-11-14 MED ORDER — DIPHENHYDRAMINE HCL 50 MG/ML IJ SOLN
INTRAMUSCULAR | Status: AC
  Filled 2023-11-14: qty 1

## 2023-11-14 MED ORDER — MIDAZOLAM HCL 2 MG/2ML IJ SOLN
INTRAMUSCULAR | Status: AC
  Filled 2023-11-14: qty 2

## 2023-11-14 MED ORDER — FENTANYL CITRATE (PF) 100 MCG/2ML IJ SOLN
INTRAMUSCULAR | Status: AC | PRN
  Administered 2023-11-14: 50 ug via INTRAVENOUS

## 2023-11-14 MED ORDER — FENTANYL CITRATE (PF) 100 MCG/2ML IJ SOLN
INTRAMUSCULAR | Status: AC
  Filled 2023-11-14: qty 2

## 2023-11-14 MED ORDER — LIDOCAINE HCL 1 % IJ SOLN
10.0000 mL | Freq: Once | INTRAMUSCULAR | Status: DC
Start: 2023-11-14 — End: 2023-11-15

## 2023-11-14 MED ORDER — SODIUM CHLORIDE 0.9 % IV SOLN: INTRAVENOUS | Status: DC

## 2023-11-14 MED ORDER — HYDROCODONE-ACETAMINOPHEN 5-325 MG PO TABS
1.0000 | ORAL_TABLET | Freq: Once | ORAL | Status: AC
  Administered 2023-11-14: 1 via ORAL
  Filled 2023-11-14: qty 1

## 2023-11-14 NOTE — Procedures (Signed)
 Interventional Radiology Procedure Note  Procedure: US  guided medical liver biopsy, left liver  Complications: None EBL: None Recommendations: - Bedrest 2 hours.   - Routine wound care - Follow up pathology - Advance diet   Signed,  Myrlene Asper, DO

## 2023-11-14 NOTE — H&P (Signed)
 Chief Complaint: Patient was seen in consultation today for Cirrhosis-- liver core biopsy at the request of Pyrtle,Jay M  Referring Physician(s): Pyrtle,Jay M  Supervising Physician: Myrlene Asper  Patient Status: [facility]  History of Present Illness: Sydney Mccoy is a 53 y.o. 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 Gallstones were noted without thickening or biliary ductal dilatation, and there were prominent porta hepatis lymph nodes likely reactive in nature, along with small volume loculated perihepatic ascites.  Fatty liver disease  Scheduled for liver core biopsy in IR today     Past Medical History:  Diagnosis Date   Cirrhosis (HCC)    COPD (chronic obstructive pulmonary disease) (HCC)    Diverticulitis    Tachycardia     Past Surgical History:  Procedure Laterality Date   ABDOMINAL HERNIA REPAIR     INSERTION OF MESH     OOPHORECTOMY Left    PATELLA FRACTURE SURGERY Left     Allergies: Patient has no known allergies.  Medications: Prior to Admission medications   Medication Sig Start Date End Date Taking? Authorizing Provider  hydrOXYzine  (ATARAX ) 25 MG tablet Take 1 tablet (25 mg total) by mouth 3 (three) times daily as needed. 10/19/23  Yes Pashayan, Knute Perla, MD  mirtazapine  (REMERON ) 15 MG tablet Take 1 tablet (15 mg total) by mouth at bedtime. 11/08/23  Yes Pashayan, Knute Perla, MD  omeprazole  (PRILOSEC) 40 MG capsule Take 1 capsule (40 mg total) by mouth daily. 10/08/23  Yes Pyrtle, Amber Bail, MD  propranolol  (INDERAL ) 20 MG tablet Take 1 tablet (20 mg total) by mouth 3 (three) times daily. 11/08/23  Yes Pashayan, Knute Perla, MD  venlafaxine   XR (EFFEXOR  XR) 150 MG 24 hr capsule Take 1 capsule (150 mg total) by mouth daily. 09/28/23  Yes Pashayan, Knute Perla, MD     Family History  Problem Relation Age of Onset   Heart attack Father    Colon cancer Neg Hx    Esophageal cancer Neg Hx    Stomach cancer Neg Hx     Social History   Socioeconomic History   Marital status: Divorced    Spouse name: Not on file   Number of children: 1   Years of education: Not on file   Highest education level: Not on file  Occupational History   Occupation: N/A  Tobacco Use   Smoking status: Former    Types: Cigarettes   Smokeless tobacco: Never  Vaping Use   Vaping status: Never Used  Substance and Sexual Activity   Alcohol use: Yes    Alcohol/week: 28.0 standard drinks of alcohol    Types: 28 Standard drinks or equivalent per week    Comment: 4 beers on the weekend   Drug use: Yes    Comment: CBD oil   Sexual activity: Not on file  Other Topics Concern   Not on file  Social History Narrative   Not on file   Social Drivers of Health   Financial Resource Strain: Not on file  Food Insecurity: Not on file  Transportation Needs: Not on file  Physical Activity: Not on file  Stress: Not on file  Social Connections: Not on file  Review of Systems: A 12 point ROS discussed and pertinent positives are indicated in the HPI above.  All other systems are negative.  Review of Systems  Constitutional:  Negative for activity change, fatigue and fever.  Respiratory:  Negative for cough and shortness of breath.   Cardiovascular:  Negative for chest pain.  Gastrointestinal:  Positive for abdominal pain. Negative for nausea and vomiting.  Psychiatric/Behavioral:  Negative for behavioral problems and confusion.     Vital Signs: BP 119/88   Pulse 74   Temp 98.8 F (37.1 C) (Oral)   Resp 16   Ht 5\' 7"  (1.702 m)   Wt 198 lb (89.8 kg)   LMP 03/25/2014 (Approximate)   SpO2 96%   BMI 31.01 kg/m     Physical Exam Vitals  reviewed.  HENT:     Mouth/Throat:     Mouth: Mucous membranes are moist.  Cardiovascular:     Rate and Rhythm: Normal rate and regular rhythm.     Heart sounds: No murmur heard. Pulmonary:     Effort: Pulmonary effort is normal.     Breath sounds: Normal breath sounds. No wheezing.  Abdominal:     Palpations: Abdomen is soft.     Tenderness: There is no abdominal tenderness.  Musculoskeletal:        General: Normal range of motion.  Skin:    General: Skin is warm.  Neurological:     Mental Status: She is alert and oriented to person, place, and time.  Psychiatric:        Behavior: Behavior normal.     Imaging: No results found.  Labs:  CBC: Recent Labs    01/16/23 1616 07/17/23 1217 11/14/23 1114  WBC 7.3 4.3 4.1  HGB 12.9 13.3 12.9  HCT 38.7 39.6 38.5  PLT 135* 102.0* 123*    COAGS: Recent Labs    01/16/23 2220 07/17/23 1217  INR 1.3* 1.4*    BMP: Recent Labs    01/16/23 1616 07/17/23 1217  NA 137 139  K 4.0 4.3  CL 105 103  CO2 23 26  GLUCOSE 79 94  BUN 6 9  CALCIUM 9.0 9.3  CREATININE 0.56 0.67  GFRNONAA >60  --     LIVER FUNCTION TESTS: Recent Labs    01/16/23 1616 07/17/23 1217  BILITOT 1.0 0.8  AST 31 63*  ALT 33 61*  ALKPHOS 66 68  PROT 8.3* 8.2  ALBUMIN 3.6 4.3    TUMOR MARKERS: Recent Labs    07/17/23 1217  AFPTM 9.4*    Assessment and Plan:  Scheduled today for liver core biopsy Risks and benefits of liver core biopsy was discussed with the patient and/or patient's family including, but not limited to bleeding, infection, damage to adjacent structures or low yield requiring additional tests.  All of the questions were answered and there is agreement to proceed.  Consent signed and in chart.  Thank you for this interesting consult.  I greatly enjoyed meeting Sydney Mccoy and look forward to participating in their care.  A copy of this report was sent to the requesting provider on this date.  Electronically  Signed: Ellen Guppy, PA-C 11/14/2023, 11:50 AM   I spent a total of  30 Minutes   in face to face in clinical consultation, greater than 50% of which was counseling/coordinating care for liver core biosy

## 2023-11-21 ENCOUNTER — Ambulatory Visit: Payer: Self-pay | Admitting: Internal Medicine

## 2023-11-21 DIAGNOSIS — K7460 Unspecified cirrhosis of liver: Secondary | ICD-10-CM

## 2023-11-21 LAB — SURGICAL PATHOLOGY

## 2023-11-22 ENCOUNTER — Other Ambulatory Visit: Payer: Self-pay

## 2023-11-26 NOTE — Progress Notes (Signed)
 Please let patient know that the liver biopsy confirms cirrhosis; and shows inflammation of the liver.  There are some fatty liver inflammation but autoimmune hepatitis could not be excluded by our local pathologist She does have a positive ANA at 1:1280 I would like Dr. Nina Basil at [facility] to give me a second opinion on the liver biopsy results Please let Dr. Noma Bay know that the patient has cirrhosis but a positive ANA and borderline IgG  Please let patient know that we are getting a second opinion on the liver biopsy and will let her know once we see these Stana Ear please keep an eye out for Dr. Cornel Diesel over read of this liver bx  Amber Bail. Dash Cardarelli, M.D.  11/26/2023

## 2023-11-30 ENCOUNTER — Ambulatory Visit (HOSPITAL_COMMUNITY): Admitting: Student in an Organized Health Care Education/Training Program

## 2023-12-06 ENCOUNTER — Other Ambulatory Visit: Payer: Self-pay

## 2023-12-06 ENCOUNTER — Encounter: Payer: Self-pay | Admitting: Internal Medicine

## 2023-12-06 DIAGNOSIS — K7460 Unspecified cirrhosis of liver: Secondary | ICD-10-CM | POA: Insufficient documentation

## 2023-12-06 NOTE — Telephone Encounter (Signed)
 Please let patient know that the second opinion from Dr. 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 She has been abstinent from alcohol and needs to remain so permanently I would encourage her to discuss metabolic management closely with her primary care provider for tight control of blood pressure, glucose, cholesterol, triglycerides and weight. This is the best treatment for fatty liver related cirrhosis.  She is due MRI in July of the liver with and without contrast, HCC protocol  Follow-up with me thereafter.

## 2023-12-20 ENCOUNTER — Other Ambulatory Visit (HOSPITAL_COMMUNITY): Payer: Self-pay | Admitting: Student in an Organized Health Care Education/Training Program

## 2023-12-20 ENCOUNTER — Other Ambulatory Visit: Payer: Self-pay

## 2023-12-20 DIAGNOSIS — F411 Generalized anxiety disorder: Secondary | ICD-10-CM

## 2023-12-20 DIAGNOSIS — F332 Major depressive disorder, recurrent severe without psychotic features: Secondary | ICD-10-CM

## 2023-12-20 MED ORDER — VENLAFAXINE HCL ER 150 MG PO CP24
150.0000 mg | ORAL_CAPSULE | Freq: Every day | ORAL | 2 refills | Status: DC
  Filled 2023-12-20: qty 30, 30d supply, fill #0
  Filled 2024-01-02 – 2024-01-26 (×5): qty 30, 30d supply, fill #1
  Filled 2024-02-21: qty 30, 30d supply, fill #2

## 2023-12-20 NOTE — Telephone Encounter (Signed)
 Received request from patient's pharmacy for refill of her Effexor .  This was sent in.    Sent: -Effexor  XR 150 mg daily.  30 capsules with 2 refills.    Rainell Buoy DO Resident

## 2023-12-21 ENCOUNTER — Ambulatory Visit (INDEPENDENT_AMBULATORY_CARE_PROVIDER_SITE_OTHER): Admitting: Student in an Organized Health Care Education/Training Program

## 2023-12-21 ENCOUNTER — Encounter (HOSPITAL_COMMUNITY): Payer: Self-pay | Admitting: Student in an Organized Health Care Education/Training Program

## 2023-12-21 DIAGNOSIS — F332 Major depressive disorder, recurrent severe without psychotic features: Secondary | ICD-10-CM

## 2023-12-21 DIAGNOSIS — F411 Generalized anxiety disorder: Secondary | ICD-10-CM | POA: Diagnosis not present

## 2023-12-21 DIAGNOSIS — F410 Panic disorder [episodic paroxysmal anxiety] without agoraphobia: Secondary | ICD-10-CM

## 2023-12-21 NOTE — Progress Notes (Signed)
 [facility] [address] [place] [address] Dept: [phone] Dept Fax: [phone]  Psychotherapy Progress Note  Patient ID: Sydney Mccoy, female  DOB: August 03, 1970, 53 y.o.  MRN: [id number]  12/21/2023 Start time: 9:33 AM End time: 10: AM  Method of Visit: Face-to-Face  Present: patient  Current Concerns:  Sydney Mccoy is a 53 yr old female who presents from [facility] for Follow Up, Medication Management and Therapy.  PPHx is significant for Depression, Anxiety, Bulimia, ADHD, and Auditory Processing Disorder, Suicidal Gestures (teenage years), 1 Hospitalization (age 35- [facility]), and no History of Self Injurious Behavior.   She reports that she is feeling good.  She reports that she feels calm for the first time in a long time.  She reports that she has resolved her anxiety with Raylene Calamity and so now at this point is not afraid of anxiety anymore.  She reports he feels like a lot of this is due to this being the longest period of sobriety she is ever had.  She knows that due to her liver biopsy and issues this is what is best for her.  She reports she is not completely saying no and that for special occasions like of wedding toast she will have a drink but otherwise will not drink anymore.  She reports that she is looking forward to the trip to [place] in July.  She reports that she has become more active in preparation of the trip.  She reports that her and her mother will walk around the neighborhood every evening and for several hours a day the first 10 minutes of the hour she will walk on the treadmill if her and her mother are not out and about.  She reports that the 2 of them are also making trips into bigger events so that they do walk around the store or shopping center for a while so that they are more active.  She reports that Raylene Calamity has started his new job.  When asked about moving she reports that she will take it 1 day at a  time.  She reports overall feeling that she is calm because she is now able to feel emotions when she wants to and does not feel emotions when she does not.  Prior to leaving the appointment she confirmed she was in a stable and safe mindset.  She reports no SI, HI, or AVH.      Current Symptoms: Anxiety   Psychiatric Specialty Exam: General Appearance: Casual and Fairly Groomed  Eye Contact:  Good  Speech:  Clear and Coherent and Normal Rate  Volume:  Normal  Mood:  good, calm  Affect:  Appropriate and Congruent  Thought Process:  Coherent and Goal Directed  Orientation:  Full (Time, Place, and Person)  Thought Content:  WDL and Logical  Suicidal Thoughts:  No  Homicidal Thoughts:  No  Memory:  Immediate;   Good Recent;   Good  Judgement:  Good  Insight:  Good  Psychomotor Activity:  Normal  Concentration:  Concentration: Good and Attention Span: Good  Recall:  Good  Fund of Knowledge:Good  Language: Good  Akathisia:  Negative  Handed:  Right  AIMS (if indicated):  not done  Assets:  Communication Skills Desire for Improvement Housing Resilience Social Support  ADL's:  Intact  Cognition: WNL  Sleep:  Good     Diagnosis: GAD with Panic Disorder, MDD, Recurrent, Severe, w/out Psychosis, EtOH Abuse   Anticipated Frequency  of Visits: every 2 weeks Anticipated Length of Treatment Episode: 20  Short Term Goals/Goals for Treatment Session: Continue exercise routine Progress Towards Goals: Progressing started to make it a habit and will continue with it  Treatment Intervention: Insight-oriented therapy and Supportive therapy  Medical Necessity: Assisted patient to achieve or maintain maximum functional capacity  Assessment Tools:    02/23/2023   10:51 AM 06/16/2022   10:15 AM 04/13/2022    9:39 AM  Depression screen PHQ 2/9  Decreased Interest 2 0 3  Down, Depressed, Hopeless 1 0 3  PHQ - 2 Score 3 0 6  Altered sleeping 0 0 3  Tired, decreased energy 2 2  3   Change in appetite 3 0 3  Feeling bad or failure about yourself  2 2 3   Trouble concentrating 0 3 3  Moving slowly or fidgety/restless 0 0 3  Suicidal thoughts 0 0 0  PHQ-9 Score 10 7 24   Difficult doing work/chores  Somewhat difficult    Failed to redirect to the Timeline version of the REVFS SmartLink. Flowsheet Row US  BIOPSY [facility] & WL from 11/14/2023 in [facility] ULTRASOUND Most recent reading at 11/14/2023 11:29 AM ED from 01/16/2023 in [facility] Emergency Department at [facility] Most recent reading at 01/16/2023  4:10 PM UC from 01/16/2023 in [facility] at [facility] Most recent reading at 01/16/2023  2:53 PM  C-SSRS RISK CATEGORY No Risk No Risk No Risk    Collaboration of Care:   Patient/Guardian was advised Release of Information must be obtained prior to any record release in order to collaborate their care with an outside provider. Patient/Guardian was advised if they have not already done so to contact the registration department to sign all necessary forms in order for us  to release information regarding their care.   Consent: Patient/Guardian gives verbal consent for treatment and assignment of benefits for services provided during this visit. Patient/Guardian expressed understanding and agreed to proceed.   Plan:  Sydney Mccoy is a 53 yr old female who presents from [facility] for Follow Up, Medication Management and Therapy.  PPHx is significant for Depression, Anxiety, Bulimia, ADHD, and Auditory Processing Disorder, Suicidal Gestures (teenage years), 1 Hospitalization (age 32- [facility]), and no History of Self Injurious Behavior.   Provided talk/supportive therapy.  She reports feeling calm for the first time in a long time.  She is increasing her exercise and able to better control her emotions.  She reports finding happiness in the calm.  She will continue working on exercising and begin planning her next steps after the trip to [place].   Prior to leaving the appointment she confirmed she was in a stable and safe mindset.  She reports no SI, HI, or AVH.      GAD  MDD:  -Continue Effexor  XR 150 mg daily for depression and anxiety.  No refills sent at this time. -Continue Remeron  15 mg QHS for depression, andxiety, sleep, and appetite.  No refills sent at this time. -Continue Propanolol 20 mg TID for anxiety and HTN.  No refills sent at this time. -Continue Hydroxyzine  25 mg TID PRN for anxiety.  No refills sent at this time.    Basilia Bosworth, DO 12/21/2023

## 2023-12-26 ENCOUNTER — Ambulatory Visit: Admitting: Internal Medicine

## 2023-12-26 ENCOUNTER — Encounter: Payer: Self-pay | Admitting: Internal Medicine

## 2023-12-26 VITALS — BP 135/85 | HR 79 | Temp 98.2°F | Resp 16 | Ht 66.5 in | Wt 199.0 lb

## 2023-12-26 DIAGNOSIS — D124 Benign neoplasm of descending colon: Secondary | ICD-10-CM | POA: Diagnosis not present

## 2023-12-26 DIAGNOSIS — K621 Rectal polyp: Secondary | ICD-10-CM

## 2023-12-26 DIAGNOSIS — K635 Polyp of colon: Secondary | ICD-10-CM | POA: Diagnosis not present

## 2023-12-26 DIAGNOSIS — K7460 Unspecified cirrhosis of liver: Secondary | ICD-10-CM | POA: Diagnosis not present

## 2023-12-26 DIAGNOSIS — K648 Other hemorrhoids: Secondary | ICD-10-CM | POA: Diagnosis not present

## 2023-12-26 DIAGNOSIS — D123 Benign neoplasm of transverse colon: Secondary | ICD-10-CM

## 2023-12-26 DIAGNOSIS — Z1211 Encounter for screening for malignant neoplasm of colon: Secondary | ICD-10-CM

## 2023-12-26 DIAGNOSIS — Z860100 Personal history of colon polyps, unspecified: Secondary | ICD-10-CM | POA: Diagnosis not present

## 2023-12-26 DIAGNOSIS — K5730 Diverticulosis of large intestine without perforation or abscess without bleeding: Secondary | ICD-10-CM | POA: Diagnosis not present

## 2023-12-26 DIAGNOSIS — D120 Benign neoplasm of cecum: Secondary | ICD-10-CM | POA: Diagnosis not present

## 2023-12-26 DIAGNOSIS — J449 Chronic obstructive pulmonary disease, unspecified: Secondary | ICD-10-CM | POA: Diagnosis not present

## 2023-12-26 DIAGNOSIS — D128 Benign neoplasm of rectum: Secondary | ICD-10-CM | POA: Diagnosis not present

## 2023-12-26 MED ORDER — SODIUM CHLORIDE 0.9 % IV SOLN: 500.0000 mL | Freq: Once | INTRAVENOUS | Status: DC

## 2023-12-26 NOTE — Patient Instructions (Signed)

## 2023-12-26 NOTE — Progress Notes (Signed)
 Pt sedate, gd SR's, VSS, report to RN

## 2023-12-26 NOTE — Progress Notes (Signed)
 Patient ID: Sydney Mccoy, female   DOB: March 11, 1971, 53 y.o.   MRN: [id number]    GASTROENTEROLOGY PROCEDURE H&P NOTE   Primary Care Physician: Pcp, No    Reason for Procedure:   Hx of polyps  Plan:    colonoscopy  Patient is appropriate for endoscopic procedure(s) in the ambulatory ([facility]) setting.  The nature of the procedure, as well as the risks, benefits, and alternatives were carefully and thoroughly reviewed with the patient. Ample time for discussion and questions allowed. The patient understood, was satisfied, and agreed to proceed.     HPI: Sydney Mccoy is a 53 y.o. female who presents for colonoscopy.  Medical history as below.  Tolerated the prep.  No recent chest pain or shortness of breath.  No abdominal pain today.  Past Medical History:  Diagnosis Date   Cirrhosis (HCC)    COPD (chronic obstructive pulmonary disease) (HCC)    Diverticulitis    Tachycardia     Past Surgical History:  Procedure Laterality Date   ABDOMINAL HERNIA REPAIR     INSERTION OF MESH     OOPHORECTOMY Left    PATELLA FRACTURE SURGERY Left     Prior to Admission medications   Medication Sig Start Date End Date Taking? Authorizing Provider  hydrOXYzine  (ATARAX ) 25 MG tablet Take 1 tablet (25 mg total) by mouth 3 (three) times daily as needed. 10/19/23  Yes Pashayan, Knute Perla, DO  mirtazapine  (REMERON ) 15 MG tablet Take 1 tablet (15 mg total) by mouth at bedtime. 11/08/23  Yes Pashayan, Knute Perla, DO  omeprazole  (PRILOSEC) 40 MG capsule Take 1 capsule (40 mg total) by mouth daily. 10/08/23  Yes Dierks Wach, Amber Bail, MD  propranolol  (INDERAL ) 20 MG tablet Take 1 tablet (20 mg total) by mouth 3 (three) times daily. 11/08/23  Yes Pashayan, Knute Perla, DO  venlafaxine  XR (EFFEXOR  XR) 150 MG 24 hr capsule Take 1 capsule (150 mg total) by mouth daily. 12/20/23  Yes Pashayan, Knute Perla, DO    Current Outpatient Medications  Medication Sig Dispense Refill   hydrOXYzine  (ATARAX ) 25 MG tablet  Take 1 tablet (25 mg total) by mouth 3 (three) times daily as needed. 30 tablet 1   mirtazapine  (REMERON ) 15 MG tablet Take 1 tablet (15 mg total) by mouth at bedtime. 30 tablet 1   omeprazole  (PRILOSEC) 40 MG capsule Take 1 capsule (40 mg total) by mouth daily. 90 capsule 1   propranolol  (INDERAL ) 20 MG tablet Take 1 tablet (20 mg total) by mouth 3 (three) times daily. 90 tablet 1   venlafaxine  XR (EFFEXOR  XR) 150 MG 24 hr capsule Take 1 capsule (150 mg total) by mouth daily. 30 capsule 2   Current Facility-Administered Medications  Medication Dose Route Frequency Provider Last Rate Last Admin   0.9 %  sodium chloride  infusion  500 mL Intravenous Once Bita Cartwright, Amber Bail, MD        Allergies as of 12/26/2023   (No Known Allergies)    Family History  Problem Relation Age of Onset   Heart attack Father    Colon cancer Neg Hx    Esophageal cancer Neg Hx    Stomach cancer Neg Hx    Rectal cancer Neg Hx     Social History   Socioeconomic History   Marital status: Divorced    Spouse name: Not on file   Number of children: 1   Years of education: Not on file   Highest education level: Not on file  Occupational History   Occupation: N/A  Tobacco Use   Smoking status: Former    Types: Cigarettes   Smokeless tobacco: Never  Vaping Use   Vaping status: Never Used  Substance and Sexual Activity   Alcohol use: Not Currently    Alcohol/week: 28.0 standard drinks of alcohol    Types: 28 Standard drinks or equivalent per week    Comment: 4 beers on the weekend - October 30, 2023 quit date   Drug use: Yes    Comment: CBD oil   Sexual activity: Not on file  Other Topics Concern   Not on file  Social History Narrative   Not on file   Social Drivers of Health   Financial Resource Strain: Not on file  Food Insecurity: Not on file  Transportation Needs: Not on file  Physical Activity: Not on file  Stress: Not on file  Social Connections: Not on file  Intimate Partner Violence: Not on  file    Physical Exam: Vital signs in last 24 hours: @BP  123/79   Pulse 81   Temp 98.2 F (36.8 C) (Temporal)   Resp 15   Ht 5' 6.5 (1.689 m)   Wt 199 lb (90.3 kg)   LMP 03/25/2014 (Approximate)   SpO2 98%   BMI 31.64 kg/m  GEN: NAD EYE: Sclerae anicteric ENT: MMM CV: Non-tachycardic Pulm: CTA b/l GI: Soft, NT/ND NEURO:  Alert & Oriented x 3   Laurell Pond, MD [facility]  12/26/2023 9:53 AM

## 2023-12-26 NOTE — Progress Notes (Signed)
 Called to room to assist during endoscopic procedure.  Patient ID and intended procedure confirmed with present staff. Received instructions for my participation in the procedure from the performing physician.

## 2023-12-26 NOTE — Op Note (Signed)
 [facility] Patient Name: Sydney Mccoy Procedure Date: 12/26/2023 9:33 AM MRN: [id number] Endoscopist: Nannette Babe , MD, [id number] Age: 53 Referring MD:  Date of Birth: 03-Sep-1970 Gender: Female Account #: [id number] Procedure:                Colonoscopy Indications:              High risk colon cancer surveillance: Personal                            history of colonic polyps Medicines:                Monitored Anesthesia Care Procedure:                Pre-Anesthesia Assessment:                           - Prior to the procedure, a History and Physical                            was performed, and patient medications and                            allergies were reviewed. The patient's tolerance of                            previous anesthesia was also reviewed. The risks                            and benefits of the procedure and the sedation                            options and risks were discussed with the patient.                            All questions were answered, and informed consent                            was obtained. Prior Anticoagulants: The patient has                            taken no anticoagulant or antiplatelet agents. ASA                            Grade Assessment: III - A patient with severe                            systemic disease. After reviewing the risks and                            benefits, the patient was deemed in satisfactory                            condition to undergo the procedure.  After obtaining informed consent, the colonoscope                            was passed under direct vision. Throughout the                            procedure, the patient's blood pressure, pulse, and                            oxygen saturations were monitored continuously. The                            CF [id number] #[id number] was introduced through the anus                            and advanced to the cecum,  identified by                            appendiceal orifice and ileocecal valve. The                            colonoscopy was performed without difficulty. The                            patient tolerated the procedure well. The quality                            of the bowel preparation was good. The ileocecal                            valve, appendiceal orifice, and rectum were                            photographed. Scope In: 9:59:49 AM Scope Out: 10:17:46 AM Scope Withdrawal Time: 0 hours 15 minutes 17 seconds  Total Procedure Duration: 0 hours 17 minutes 57 seconds  Findings:                 The digital rectal exam was normal.                           A 4 mm polyp was found in the cecum. The polyp was                            sessile. The polyp was removed with a cold snare.                            Resection and retrieval were complete.                           An 8 mm polyp was found in the transverse colon.                            The polyp was sessile. The polyp was  removed with a                            cold snare. Resection and retrieval were complete.                           Two sessile polyps were found in the descending                            colon. The polyps were 4 to 5 mm in size. These                            polyps were removed with a cold snare. Resection                            and retrieval were complete.                           A 7 mm polyp was found in the rectum. The polyp was                            flat. The polyp was removed with a cold snare.                            Resection and retrieval were complete.                           Multiple medium-mouthed and small-mouthed                            diverticula were found in the descending colon.                           There was evidence of a prior end-to-side                            colo-colonic anastomosis in the recto-sigmoid                            colon. This  was patent and was characterized by                            healthy appearing mucosa.                           Retroflexion in the rectum was not performed due to                            post-surgical anatomy.                           Internal hemorrhoids were found during endoscopy.                            The  hemorrhoids were medium-sized. Complications:            No immediate complications. Estimated Blood Loss:     Estimated blood loss was minimal. Impression:               - One 4 mm polyp in the cecum, removed with a cold                            snare. Resected and retrieved.                           - One 8 mm polyp in the transverse colon, removed                            with a cold snare. Resected and retrieved.                           - Two 4 to 5 mm polyps in the descending colon,                            removed with a cold snare. Resected and retrieved.                           - One 7 mm polyp in the rectum, removed with a cold                            snare. Resected and retrieved.                           - Mild diverticulosis in the descending colon.                           - Patent end-to-side colo-colonic anastomosis,                            characterized by healthy appearing mucosa.                           - Internal hemorrhoids. Recommendation:           - Patient has a contact number available for                            emergencies. The signs and symptoms of potential                            delayed complications were discussed with the                            patient. Return to normal activities tomorrow.                            Written discharge instructions were provided to the  patient.                           - Resume previous diet.                           - Continue present medications.                           - Await pathology results.                           - Repeat colonoscopy  is recommended for                            surveillance. The colonoscopy date will be                            determined after pathology results from today's                            exam become available for review. Nannette Babe, MD 12/26/2023 10:23:49 AM This report has been signed electronically.

## 2023-12-27 ENCOUNTER — Telehealth: Payer: Self-pay

## 2023-12-27 NOTE — Telephone Encounter (Signed)
 Follow up call to pt, lm for pt to call if having any difficulty with normal activities or eating and drinking.  Also to call if any other questions or concerns.

## 2024-01-01 ENCOUNTER — Other Ambulatory Visit: Payer: Self-pay

## 2024-01-01 ENCOUNTER — Other Ambulatory Visit (HOSPITAL_COMMUNITY): Payer: Self-pay | Admitting: Student in an Organized Health Care Education/Training Program

## 2024-01-01 DIAGNOSIS — F410 Panic disorder [episodic paroxysmal anxiety] without agoraphobia: Secondary | ICD-10-CM

## 2024-01-01 DIAGNOSIS — F332 Major depressive disorder, recurrent severe without psychotic features: Secondary | ICD-10-CM

## 2024-01-01 DIAGNOSIS — F411 Generalized anxiety disorder: Secondary | ICD-10-CM

## 2024-01-01 MED ORDER — MIRTAZAPINE 15 MG PO TABS
15.0000 mg | ORAL_TABLET | Freq: Every day | ORAL | 1 refills | Status: DC
  Filled 2024-01-01: qty 30, 30d supply, fill #0
  Filled 2024-01-30: qty 30, 30d supply, fill #1

## 2024-01-01 MED ORDER — PROPRANOLOL HCL 20 MG PO TABS
20.0000 mg | ORAL_TABLET | Freq: Three times a day (TID) | ORAL | 1 refills | Status: DC
  Filled 2024-01-01: qty 90, 30d supply, fill #0
  Filled 2024-02-05: qty 90, 30d supply, fill #1

## 2024-01-01 NOTE — Telephone Encounter (Signed)
 Received request from patient's pharmacy for refill of her Remeron .  This was sent.    Sent: -Remeron  15 mg QHS.  30 tablets with 1 refill.    Marolyn Rosser DO Resident

## 2024-01-01 NOTE — Telephone Encounter (Signed)
 Received message from patient's pharmacy for refill of her Propanolol.  This was sent.   Sent: -Propanolol 20 mg TID.  90 tablets with 1 refill.    Marolyn Rosser DO Resident

## 2024-01-02 ENCOUNTER — Other Ambulatory Visit: Payer: Self-pay

## 2024-01-02 LAB — SURGICAL PATHOLOGY

## 2024-01-03 ENCOUNTER — Other Ambulatory Visit: Payer: Self-pay

## 2024-01-04 ENCOUNTER — Other Ambulatory Visit: Payer: Self-pay

## 2024-01-07 ENCOUNTER — Ambulatory Visit: Payer: Self-pay | Admitting: Internal Medicine

## 2024-01-09 ENCOUNTER — Other Ambulatory Visit: Payer: Self-pay

## 2024-01-09 ENCOUNTER — Telehealth: Payer: Self-pay

## 2024-01-09 DIAGNOSIS — K7460 Unspecified cirrhosis of liver: Secondary | ICD-10-CM

## 2024-01-09 NOTE — Telephone Encounter (Signed)
-----   Message from Nurse Rock DEL sent at 12/06/2023  9:59 AM EDT ----- Regarding: MRI Liver Pt needs MRI in July and OV to follow with Pyrtle.

## 2024-01-09 NOTE — Telephone Encounter (Signed)
 Follow up on MRI liver appt.

## 2024-01-15 ENCOUNTER — Encounter (HOSPITAL_COMMUNITY): Payer: Self-pay

## 2024-01-15 ENCOUNTER — Ambulatory Visit (HOSPITAL_COMMUNITY): Admitting: Student in an Organized Health Care Education/Training Program

## 2024-01-15 NOTE — Progress Notes (Unsigned)
 [facility] [address] [place] [address] Dept: [phone] Dept Fax: [phone]  Psychotherapy Progress Note  Patient ID: Sydney Mccoy, female  DOB: 1971/04/22, 53 y.o.  MRN: [id number]  01/15/2024 Start time: 1:*** AM End time: 8:52 AM  Method of Visit: Face-to-Face  Present: patient  Current Concerns:  She reports continuing to have significant issues with anxiety.  She reports that she has not had any panic attacks but we will feel anxious at things that never made her anxious.  She reports that Marolyn and his girlfriend will be coming to visit and she will be meeting Alex's girlfriend's mother for the first time.  She reports anxiety over this due to not measuring up to her.  She reports that the girlfriend's mother is an attorney who used to work with her father and is now a judge.  Guided her through the thought process of what their different positions meant and she reports understanding that ultimately it does not matter.  She reports she does feel like her mother will spend a lot of the time talking with the girlfriend's mother which will help reduce some of the stress.  She reports her mother was jealous of the fact that she was looking for a good present for [name]'s father Elgin due to Slater passing.  Discussed with her that it could be her mother not processing the loss of her husband 26 years ago and also that it could be throwing into sharp contrast her mother's own mortality.  Encouraged her to begin daily meditation/journaling.  Also discussed with her positive affirmations as a way to counter the automatic negative thoughts which she has been having.  She reports she does have a journal that Juliene gave her and so she will begin daily writing.  Encouraged her to continue focusing on the positive progress she has made and she does report understanding she has made significant strides.  Prior to leaving the  appointment she confirmed she was in a stable and safe mindset.  She reports no SI, HI, or AVH.    Current Symptoms: Family Stress  Psychiatric Specialty Exam: General Appearance: Casual and Fairly Groomed  Eye Contact:  Good  Speech:  Clear and Coherent and Normal Rate  Volume:  Normal  Mood:  Anxious  Affect:  Congruent  Thought Process:  Coherent and Goal Directed  Orientation:  Full (Time, Place, and Person)  Thought Content:  WDL and Logical  Suicidal Thoughts:  No  Homicidal Thoughts:  No  Memory:  Immediate;   Good Recent;   Good  Judgement:  Good  Insight:  Good  Psychomotor Activity:  Normal  Concentration:  Concentration: Good and Attention Span: Good  Recall:  Good  Fund of Knowledge:Good  Language: Good  Akathisia:  Negative  Handed:  Right  AIMS (if indicated):  not done  Assets:  Communication Skills Desire for Improvement Housing Resilience Social Support  ADL's:  Intact  Cognition: WNL  Sleep:  Good     Diagnosis: GAD with Panic Disorder, MDD, Recurrent, Severe, w/out Psychosis, EtOH Abuse   Anticipated Frequency of Visits: every 2 weeks Anticipated Length of Treatment Episode: 20  Short Term Goals/Goals for Treatment Session: Begin daily journaling and daily positive affirmation/gratitude Progress Towards Goals: Initial   Treatment Intervention: Insight-oriented therapy and Supportive therapy  Medical Necessity: Assisted patient to achieve or maintain maximum functional capacity ****** Assessment Tools:    02/23/2023   10:51 AM 06/16/2022  10:15 AM 04/13/2022    9:39 AM  Depression screen PHQ 2/9  Decreased Interest 2 0 3  Down, Depressed, Hopeless 1 0 3  PHQ - 2 Score 3 0 6  Altered sleeping 0 0 3  Tired, decreased energy 2 2 3   Change in appetite 3 0 3  Feeling bad or failure about yourself  2 2 3   Trouble concentrating 0 3 3  Moving slowly or fidgety/restless 0 0 3  Suicidal thoughts 0 0 0  PHQ-9 Score 10 7 24   Difficult doing  work/chores  Somewhat difficult    Failed to redirect to the Timeline version of the REVFS SmartLink. Flowsheet Row US  BIOPSY [facility] & WL from 11/14/2023 in [facility]AS[facility] Most recent reading at 11/14/2023 11:29 AM ED from 01/16/2023 in [facility] Emergency Department at [facility] Most recent reading at 01/16/2023  4:10 PM UC from 01/16/2023 in [facility] Most recent reading at 01/16/2023  2:53 PM  C-SSRS RISK CATEGORY No Risk No Risk No Risk    Collaboration of Care:   Patient/Guardian was advised Release of Information must be obtained prior to any record release in order to collaborate their care with an outside provider. Patient/Guardian was advised if they have not already done so to contact the registration department to sign all necessary forms in order for us  to release information regarding their care.   Consent: Patient/Guardian gives verbal consent for treatment and assignment of benefits for services provided during this visit. Patient/Guardian expressed understanding and agreed to proceed.   Plan: Provided talk/supportive therapy.  She reports ***     GAD  MDD:  -Increase Effexor  XR to 112.5 mg daily for depression and anxiety.  90 (37.5 mg) capsules with 1 refill, -Continue Remeron  15 mg QHS for depression, andxiety, sleep, and appetite.  No refills sent at this time. -Increase Propanolol to 20 mg TID for anxiety and HTN.  60 tablets with 1 refill. -Continue Hydroxyzine  25 mg TID PRN for anxiety.  No refills sent at this time.    Marsa GORMAN Rosser, DO 01/15/2024

## 2024-01-17 NOTE — Telephone Encounter (Signed)
 Pt will discuss with Dr Albertus at the time of her office visit.

## 2024-01-28 ENCOUNTER — Other Ambulatory Visit: Payer: Self-pay

## 2024-02-01 ENCOUNTER — Telehealth: Payer: Self-pay

## 2024-02-01 NOTE — Telephone Encounter (Signed)
  Letter mailed to pt requesting she contact [phone] to schedule MRI.

## 2024-02-06 ENCOUNTER — Other Ambulatory Visit: Payer: Self-pay

## 2024-02-12 ENCOUNTER — Ambulatory Visit (INDEPENDENT_AMBULATORY_CARE_PROVIDER_SITE_OTHER): Admitting: Student in an Organized Health Care Education/Training Program

## 2024-02-12 ENCOUNTER — Encounter (HOSPITAL_COMMUNITY): Payer: Self-pay | Admitting: Student in an Organized Health Care Education/Training Program

## 2024-02-12 VITALS — BP 135/82 | HR 89 | Ht 66.5 in | Wt 204.4 lb

## 2024-02-12 DIAGNOSIS — F410 Panic disorder [episodic paroxysmal anxiety] without agoraphobia: Secondary | ICD-10-CM

## 2024-02-12 DIAGNOSIS — F411 Generalized anxiety disorder: Secondary | ICD-10-CM

## 2024-02-12 DIAGNOSIS — F3341 Major depressive disorder, recurrent, in partial remission: Secondary | ICD-10-CM

## 2024-02-12 NOTE — Progress Notes (Signed)
 [facility] [address] [place] [address] Dept: [phone] Dept Fax: [phone]  Psychotherapy Progress Note  Patient ID: SOWMYA PARTRIDGE, female  DOB: 1971/05/15, 53 y.o.  MRN: [id number]  02/12/2024 Start time: 2:02 AM End time: 2:47 AM  Method of Visit: Face-to-Face  Present: patient  Current Concerns:  She reports her trip to [place] with her family was very fine.  She reports they were able to see a lot of different things and went to multiple museums which was great.  She reports there were some issues about her son and Marolyn treating her and her mother like children but she does realize things do change over time and that ultimately it was still an amazing trip and does want to go back.  She reports that she continues to feel good.  She reports that she has not had any anxiety and is not having any fear about having anxiety.  She reports at this point she is just unsure of what to do next.  She reports that things have gotten better with her no longer drinking.  She reports she knows she needs to still address several medical issues.  She reports wanting to establish with a PCP like her GI specialist had recommended so she can receive treatment for her COPD.  She also reports that her cousin was recently diagnosed with breast cancer and knows she needs to begin doing screenings for that.   Discussed starting a gratitude journal with her.  She reports that this is something she can do and will begin 1.  Prior to leaving the appointment she confirmed she was in a stable and safe mindset.  She reports no SI, HI, or AVH.      Current Symptoms: Family Stress  Psychiatric Specialty Exam: General Appearance: Casual and Fairly Groomed  Eye Contact:  Good  Speech:  Clear and Coherent and Normal Rate  Volume:  Normal  Mood:  Anxious  Affect:  Congruent  Thought Process:  Coherent and Goal Directed  Orientation:  Full  (Time, Place, and Person)  Thought Content:  WDL and Logical  Suicidal Thoughts:  No  Homicidal Thoughts:  No  Memory:  Immediate;   Good Recent;   Good  Judgement:  Good  Insight:  Good  Psychomotor Activity:  Normal  Concentration:  Concentration: Good and Attention Span: Good  Recall:  Good  Fund of Knowledge:Good  Language: Good  Akathisia:  Negative  Handed:  Right  AIMS (if indicated):  not done  Assets:  Communication Skills Desire for Improvement Housing Resilience Social Support  ADL's:  Intact  Cognition: WNL  Sleep:  Good     Diagnosis: GAD with Panic Disorder, MDD, Recurrent, Severe, w/out Psychosis, EtOH Abuse   Anticipated Frequency of Visits: every 2 weeks Anticipated Length of Treatment Episode: 20  Short Term Goals/Goals for Treatment Session: Begin daily journaling and daily positive affirmation/gratitude Progress Towards Goals: Initial   Treatment Intervention: Insight-oriented therapy and Supportive therapy  Medical Necessity: Assisted patient to achieve or maintain maximum functional capacity  Assessment Tools:    02/23/2023   10:51 AM 06/16/2022   10:15 AM 04/13/2022    9:39 AM  Depression screen PHQ 2/9  Decreased Interest 2 0 3  Down, Depressed, Hopeless 1 0 3  PHQ - 2 Score 3 0 6  Altered sleeping 0 0 3  Tired, decreased energy 2 2 3   Change in appetite 3 0 3  Feeling bad  or failure about yourself  2 2 3   Trouble concentrating 0 3 3  Moving slowly or fidgety/restless 0 0 3  Suicidal thoughts 0 0 0  PHQ-9 Score 10 7 24   Difficult doing work/chores  Somewhat difficult    Failed to redirect to the Timeline version of the REVFS SmartLink. Flowsheet Row US  BIOPSY [facility] & WL from 11/14/2023 in [facility] ULTRASOUND Most recent reading at 11/14/2023 11:29 AM ED from 01/16/2023 in [facility] Emergency Department at [facility] Most recent reading at 01/16/2023  4:10 PM UC from 01/16/2023 in [facility] at  [facility] Most recent reading at 01/16/2023  2:53 PM  C-SSRS RISK CATEGORY No Risk No Risk No Risk    Collaboration of Care:   Patient/Guardian was advised Release of Information must be obtained prior to any record release in order to collaborate their care with an outside provider. Patient/Guardian was advised if they have not already done so to contact the registration department to sign all necessary forms in order for us  to release information regarding their care.   Consent: Patient/Guardian gives verbal consent for treatment and assignment of benefits for services provided during this visit. Patient/Guardian expressed understanding and agreed to proceed.   Plan: Provided talk/supportive therapy.  She reports continuing to feel well but that she still has a significant amount of work to do.  She will start a gratitude journal.  Prior to leaving the appointment she confirmed she was in a stable and safe mindset.  She reports no SI, HI, or AVH.    GAD  MDD:  -Continue Effexor  XR 112.5 mg daily for depression and anxiety.  No refills sent at this time. -Continue Remeron  15 mg QHS for depression, andxiety, sleep, and appetite.  No refills sent at this time. -Continue Propanolol 20 mg TID for anxiety and HTN.  No refills sent at this time. -Continue Hydroxyzine  25 mg TID PRN for anxiety.  No refills sent at this time.    Marsa GORMAN Rosser, DO 02/12/2024

## 2024-02-20 ENCOUNTER — Ambulatory Visit (INDEPENDENT_AMBULATORY_CARE_PROVIDER_SITE_OTHER): Admitting: Internal Medicine

## 2024-02-20 ENCOUNTER — Encounter: Payer: Self-pay | Admitting: Internal Medicine

## 2024-02-20 VITALS — BP 128/76 | HR 88 | Ht 65.0 in | Wt 206.0 lb

## 2024-02-20 DIAGNOSIS — K7581 Nonalcoholic steatohepatitis (NASH): Secondary | ICD-10-CM | POA: Diagnosis not present

## 2024-02-20 DIAGNOSIS — K7460 Unspecified cirrhosis of liver: Secondary | ICD-10-CM

## 2024-02-20 DIAGNOSIS — J449 Chronic obstructive pulmonary disease, unspecified: Secondary | ICD-10-CM

## 2024-02-20 DIAGNOSIS — K5730 Diverticulosis of large intestine without perforation or abscess without bleeding: Secondary | ICD-10-CM

## 2024-02-20 DIAGNOSIS — K8020 Calculus of gallbladder without cholecystitis without obstruction: Secondary | ICD-10-CM

## 2024-02-20 DIAGNOSIS — F40240 Claustrophobia: Secondary | ICD-10-CM | POA: Diagnosis not present

## 2024-02-20 DIAGNOSIS — Z7689 Persons encountering health services in other specified circumstances: Secondary | ICD-10-CM | POA: Diagnosis not present

## 2024-02-20 DIAGNOSIS — Z860101 Personal history of adenomatous and serrated colon polyps: Secondary | ICD-10-CM | POA: Diagnosis not present

## 2024-02-20 DIAGNOSIS — Z860100 Personal history of colon polyps, unspecified: Secondary | ICD-10-CM

## 2024-02-20 DIAGNOSIS — Z87891 Personal history of nicotine dependence: Secondary | ICD-10-CM

## 2024-02-20 MED ORDER — CLONAZEPAM 1 MG PO TABS: ORAL_TABLET | ORAL | 0 refills | Status: DC

## 2024-02-20 NOTE — Progress Notes (Signed)
 Subjective:    Patient ID: Josette MARLA Stare, female    DOB: May 10, 1971, 53 y.o.   MRN: [id number]  HPI NAKETA DADDARIO is a 53 year old female with a history of colon polyps who presents for follow-up after a colonoscopy.  She has MetALD cirrhosis, history of complicated diverticulitis requiring resection in 2006 in 2007, gallstones, anxiety.  She underwent a colonoscopy on December 26, 2023, due to a personal history of colon polyps. During the procedure, five polyps were removed, ranging from four to eight millimeters in size. Findings included diverticulosis in the descending colon, an end-to-side colonic anastomosis in the rectosigmoid colon, and medium-sized hemorrhoids. The polyps were identified as two tubular adenomas, one sessile serrated polyp, and two hyperplastic polyps.  She has a history of cirrhosis confirmed by a liver biopsy on Nov 14, 2023, which showed mildly active steatohepatitis. The biopsy was reviewed by Dr. Mal at [facility], who confirmed the presence of cirrhosis and steatohepatitis. She has been avoiding alcohol, with only one glass of wine consumed during a recent trip to [place].  She has a history of COPD from years of smoking, which was exacerbated by physical activity during her trip to [place], where she walked eight miles a day. She previously used Spiriva once daily with good results but currently does not have a lung doctor or primary care provider. No current respiratory symptoms but experiences being easily winded during physical activity.  Her family history includes her father who died at 37 from a heart attack, which causes her significant anxiety. She also has a cousin recently diagnosed with breast cancer and an aunt with a history of breast cancer. She experiences anxiety, which she attributes to her father's early death and has been managing it with propranolol  for anxiousness and her behavioral health therapy.  She reports occasional awareness of gallstones but  does not feel they require intervention at this time. Her bowel habits are reported as good.  No recent issues with lower extremity edema.  No jaundice.  No itching.  No blood in stool or melena. No confusion or overt sleepiness  Review of Systems As per HPI, otherwise negative  Current Medications, Allergies, Past Medical History, Past Surgical History, Family History and Social History were reviewed in [employer] record.     Objective:   Physical Exam BP 128/76   Pulse 88   Ht 5' 5 (1.651 m)   Wt 206 lb (93.4 kg)   LMP 03/25/2014 (Approximate)   BMI 34.28 kg/m  Gen: awake, alert, NAD HEENT: anicteric  CV: RRR, no mrg Pulm: CTA b/l Abd: soft, NT/ND, +BS throughout Ext: no c/c/e Neuro: nonfocal  DIAGNOSTIC Colonoscopy: Five polyps removed, diverticulosis, end-to-side colonic anastomosis in rectosigmoid colon, medium-sized hemorrhoids (12/26/2023) EGD: Normal, no esophageal varices (10/08/2023)  PATHOLOGY Polyp Pathology: Tubular adenoma x2, sessile serrated polyp x1, hyperplastic x2 (12/26/2023) Liver Biopsy: Cirrhosis with mildly active steatohepatitis, consistent with metabolic syndrome, ethanol not excluded (11/14/2023)      Assessment & Plan:  53 year old female with a MetALD cirrhosis, history of complicated diverticulitis requiring resection in 2006 and 2007, history of colonic polyps, cholelithiasis, history of anxiety and depression who is here for follow-up.   Compensated cirrhosis due to steatohepatitis (bx confirmed May 2025) Cirrhosis confirmed by liver biopsy, consistent with steatohepatitis, likely related to metabolic syndrome. Ethanol use also previously contributing. Liver disease is currently compensated. No esophageal varices on EGD. She has been successful in avoiding alcohol, which is crucial for management.  Discussed the importance of abstaining from alcohol as there is no safe level for her condition. - Order MRI of the liver  to follow-up slightly positive AFP and porta hepatis lymphadenopathy. - Prescribe 1 mg clonazepam  to be taken one hour before MRI to manage claustrophobia. - Follow up in six months to monitor liver health. - Variceal screening due March 2027.  Cholelithiasis Intermittent awareness of gallstones, but not currently symptomatic to the point of requiring intervention. - Monitor for symptoms and report if she worsens.  Colon polyps, post-polypectomy Colonoscopy on 12-26-23 showed five polyps removed, including tubular adenomas, sessile serrated polyp and hyperplastic polyps. - Repeat colonoscopy recommended June 2028.  Diverticulosis of colon Diverticulosis noted in the descending colon during colonoscopy. No current symptoms reported.  Claustrophobia related to MRI She experiences claustrophobia during MRI, which has delayed imaging. - Prescribe 1 mg clonazepam  to be taken one hour before MRI to manage claustrophobia.  COPD  - Pulmonary referral  Need for primary care - [facility] primary care referral  30 minutes total spent today including patient facing time, coordination of care, reviewing medical history/procedures/pertinent radiology studies, and documentation of the encounter.

## 2024-02-20 NOTE — Patient Instructions (Signed)
 Please contact [facility] radiology scheduling at [phone] to schedule your MRI of the liver.   We have sent the following medications to your pharmacy for you to pick up at your convenience: clonazepam  to take 1 tablet by mouth one hour prior to MRI.  Please follow up with Sydney Mccoy in 6 months.   _______________________________________________________  If your blood pressure at your visit was 140/90 or greater, please contact your primary care physician to follow up on this.  _______________________________________________________  If you are age 53 or older, your body mass index should be between 23-30. Your Body mass index is 34.28 kg/m. If this is out of the aforementioned range listed, please consider follow up with your Primary Care Provider.  If you are age 53 or younger, your body mass index should be between 19-25. Your Body mass index is 34.28 kg/m. If this is out of the aformentioned range listed, please consider follow up with your Primary Care Provider.   ________________________________________________________  The [facility] providers would like to encourage you to use MYCHART to communicate with providers for non-urgent requests or questions.  Due to long hold times on the telephone, sending your provider a message by [facility] may be a faster and more efficient way to get a response.  Please allow 48 business hours for a response.  Please remember that this is for non-urgent requests.  _______________________________________________________  Sydney Mccoy is using a team-based approach to care.  Your team is made up of your doctor and two to three APPS. Our APPS (Nurse Practitioners and Physician Assistants) work with your physician to ensure care continuity for you. They are fully qualified to address your health concerns and develop a treatment plan. They communicate directly with your gastroenterologist to care for you. Seeing the Advanced Practice  Practitioners on your physician's team can help you by facilitating care more promptly, often allowing for earlier appointments, access to diagnostic testing, procedures, and other specialty referrals.

## 2024-02-21 NOTE — Addendum Note (Signed)
 Addended by: MADAN, Lexys Milliner L on: 02/21/2024 08:17 AM   Modules accepted: Orders

## 2024-02-25 ENCOUNTER — Other Ambulatory Visit: Payer: Self-pay

## 2024-02-28 ENCOUNTER — Other Ambulatory Visit (HOSPITAL_COMMUNITY): Payer: Self-pay | Admitting: Student in an Organized Health Care Education/Training Program

## 2024-02-28 DIAGNOSIS — F411 Generalized anxiety disorder: Secondary | ICD-10-CM

## 2024-02-28 DIAGNOSIS — F410 Panic disorder [episodic paroxysmal anxiety] without agoraphobia: Secondary | ICD-10-CM

## 2024-02-28 DIAGNOSIS — F332 Major depressive disorder, recurrent severe without psychotic features: Secondary | ICD-10-CM

## 2024-03-03 ENCOUNTER — Other Ambulatory Visit (HOSPITAL_COMMUNITY): Payer: Self-pay | Admitting: Student in an Organized Health Care Education/Training Program

## 2024-03-03 ENCOUNTER — Other Ambulatory Visit: Payer: Self-pay

## 2024-03-03 DIAGNOSIS — F410 Panic disorder [episodic paroxysmal anxiety] without agoraphobia: Secondary | ICD-10-CM

## 2024-03-03 DIAGNOSIS — F411 Generalized anxiety disorder: Secondary | ICD-10-CM

## 2024-03-03 MED ORDER — MIRTAZAPINE 15 MG PO TABS
15.0000 mg | ORAL_TABLET | Freq: Every day | ORAL | 1 refills | Status: DC
  Filled 2024-03-03: qty 30, 30d supply, fill #0
  Filled 2024-04-02: qty 30, 30d supply, fill #1

## 2024-03-03 NOTE — Telephone Encounter (Signed)
 Received a refill request from her pharmacy for Remeron .  This was sent in.   Sent: -Remeron  15 mg QHS.  30 tablets with 1 refill.    Marolyn Rosser DO

## 2024-03-04 ENCOUNTER — Other Ambulatory Visit: Payer: Self-pay

## 2024-03-04 MED ORDER — PROPRANOLOL HCL 20 MG PO TABS
20.0000 mg | ORAL_TABLET | Freq: Three times a day (TID) | ORAL | 1 refills | Status: DC
  Filled 2024-03-04: qty 90, 30d supply, fill #0
  Filled 2024-04-02: qty 90, 30d supply, fill #1

## 2024-03-04 NOTE — Telephone Encounter (Signed)
 Received message from patient's pharmacy that she needed a refill of her Propanolol, this was sent.    Sent: -Propanolol 20 mg TID.  90 tablets with 1 refill.     Marolyn Rosser DO

## 2024-03-11 ENCOUNTER — Telehealth: Payer: Self-pay | Admitting: Internal Medicine

## 2024-03-11 ENCOUNTER — Other Ambulatory Visit: Payer: Self-pay

## 2024-03-11 ENCOUNTER — Ambulatory Visit (INDEPENDENT_AMBULATORY_CARE_PROVIDER_SITE_OTHER): Admitting: Student in an Organized Health Care Education/Training Program

## 2024-03-11 DIAGNOSIS — F411 Generalized anxiety disorder: Secondary | ICD-10-CM

## 2024-03-11 DIAGNOSIS — F3341 Major depressive disorder, recurrent, in partial remission: Secondary | ICD-10-CM | POA: Diagnosis not present

## 2024-03-11 MED ORDER — CLONAZEPAM 1 MG PO TABS: ORAL_TABLET | ORAL | 0 refills | Status: AC

## 2024-03-11 NOTE — Telephone Encounter (Signed)
 Inbound call from patient stating she went to pharmacy to take out medication clonazePAM  and was told she needed to contact our office first.  Requesting a call back  Please advise  Thank you

## 2024-03-11 NOTE — Progress Notes (Signed)
 [facility] [address] [place] [address] Dept: [phone] Dept Fax: [phone]  Psychotherapy Progress Note  Patient ID: Sydney Mccoy, female  DOB: 02-Jan-1971, 53 y.o.  MRN: [id number]  03/11/2024 Start time: 2:00 AM End time: 2:47 AM  Method of Visit: Face-to-Face  Present: patient  Current Concerns:  She reports she has continued to do well and not have anxiety or anxiety about having anxiety.  She reports one issue that persists is not knowing who she is.  She questions why she still feels 15 and questions if she will ever feel like an adult.  She reports not knowing if the dynamic between her and her mother will ever change.  She reports this is in contrast to the dynamic with her son Marolyn.  She reports that she is proud of him but it feels like he is leaving her behind.  Discussed how she had defined herself in relation to him for so long that this change in dynamic would be difficult for her.  She reports she feels like she is always had issues with boundaries leading to actions she took when she was younger.  She reports things that she is never talked about with anyone and wonders if these are still impacting her today.  She reports that she does want to un burry these things at the next appointment.  She reports she has not started on positive affirmation journal yet.  Encouraged her to do this prior to her next appointment and she reports that she will.  Prior to leaving the appointment she confirmed she was in a stable and safe mindset.  She reports no SI, HI, or AVH.    Current Symptoms: Family Stress  Psychiatric Specialty Exam: General Appearance: Casual and Fairly Groomed  Eye Contact:  Good  Speech:  Clear and Coherent and Normal Rate  Volume:  Normal  Mood:  ok  Affect:  Congruent  Thought Process:  Coherent and Goal Directed  Orientation:  Full (Time, Place, and Person)  Thought Content:  WDL  and Logical  Suicidal Thoughts:  No  Homicidal Thoughts:  No  Memory:  Immediate;   Good Recent;   Good  Judgement:  Good  Insight:  Good  Psychomotor Activity:  Normal  Concentration:  Concentration: Good and Attention Span: Good  Recall:  Good  Fund of Knowledge:Good  Language: Good  Akathisia:  Negative  Handed:  Right  AIMS (if indicated):  not done  Assets:  Communication Skills Desire for Improvement Housing Resilience Social Support  ADL's:  Intact  Cognition: WNL  Sleep:  Good     Diagnosis: GAD with Panic Disorder, MDD, Recurrent, Severe, w/out Psychosis, EtOH Abuse   Anticipated Frequency of Visits: every 2 weeks Anticipated Length of Treatment Episode: 20  Short Term Goals/Goals for Treatment Session: Begin daily positive affirmation/gratitude  Progress Towards Goals: Initial   Treatment Intervention: Insight-oriented therapy and Supportive therapy  Medical Necessity: Assisted patient to achieve or maintain maximum functional capacity  Assessment Tools:    02/23/2023   10:51 AM 06/16/2022   10:15 AM 04/13/2022    9:39 AM  Depression screen PHQ 2/9  Decreased Interest 2 0 3  Down, Depressed, Hopeless 1 0 3  PHQ - 2 Score 3 0 6  Altered sleeping 0 0 3  Tired, decreased energy 2 2 3   Change in appetite 3 0 3  Feeling bad or failure about yourself  2 2 3   Trouble  concentrating 0 3 3  Moving slowly or fidgety/restless 0 0 3  Suicidal thoughts 0 0 0  PHQ-9 Score 10 7 24   Difficult doing work/chores  Somewhat difficult    Failed to redirect to the Timeline version of the REVFS SmartLink. Flowsheet Row US  BIOPSY [facility] from 11/14/2023 in [facility]TR[facility]OUND Most recent reading at 11/14/2023 11:29 AM ED from 01/16/2023 in [facility] Emergency [facility] at [facility] Most recent reading at 01/16/2023  4:10 PM UC from 01/16/2023 in [facility] at [facility] Most recent reading at 01/16/2023  2:53 PM  C-SSRS RISK CATEGORY No  Risk No Risk No Risk    Collaboration of Care:   Patient/Guardian was advised Release of Information must be obtained prior to any record release in order to collaborate their care with an outside provider. Patient/Guardian was advised if they have not already done so to contact the registration department to sign all necessary forms in order for us  to release information regarding their care.   Consent: Patient/Guardian gives verbal consent for treatment and assignment of benefits for services provided during this visit. Patient/Guardian expressed understanding and agreed to proceed.   Plan: Provided talk/supportive therapy.  She has been struggling with questions of who she is given the recent changes in dynamic with her son and the nonchanging dynamic with her mother.  She has been thinking this dynamic will change once she moves in with [name].  She does want to discuss past traumas which we will do at her next appointment.  Encouraged her to begin a positive affirmations journal.  Prior to leaving the appointment she confirmed she was in a stable and safe mindset.  She reports no SI, HI, or AVH.   GAD  MDD:  -Continue Effexor  XR 112.5 mg daily for depression and anxiety.  No refills sent at this time. -Continue Remeron  15 mg QHS for depression, andxiety, sleep, and appetite.  No refills sent at this time. -Continue Propanolol 20 mg TID for anxiety and HTN.  No refills sent at this time. -Continue Hydroxyzine  25 mg TID PRN for anxiety.  No refills sent at this time.    Marsa GORMAN Rosser, DO 03/11/2024

## 2024-03-11 NOTE — Telephone Encounter (Signed)
 Contacted [facility] pharmacy to find out why patient cannot pick up her prescription for clonazepam . Pharmacist states that the last prescription that have on file is from a year ago. Informed the pharmacist I did fax a prescription last month when she was seen in the office. Informed her I will fax the prescription again.  Informed patient that I will fax the prescription again to the pharmacy. Patient verbalized understanding.

## 2024-03-17 ENCOUNTER — Ambulatory Visit (HOSPITAL_COMMUNITY)
Admission: RE | Admit: 2024-03-17 | Discharge: 2024-03-17 | Disposition: A | Discharge status: Home / Self Care | Source: Ambulatory Visit | Attending: Internal Medicine | Admitting: Internal Medicine

## 2024-03-17 DIAGNOSIS — K5730 Diverticulosis of large intestine without perforation or abscess without bleeding: Secondary | ICD-10-CM | POA: Diagnosis not present

## 2024-03-17 DIAGNOSIS — K7581 Nonalcoholic steatohepatitis (NASH): Secondary | ICD-10-CM | POA: Insufficient documentation

## 2024-03-17 DIAGNOSIS — K7460 Unspecified cirrhosis of liver: Secondary | ICD-10-CM | POA: Insufficient documentation

## 2024-03-17 DIAGNOSIS — K7689 Other specified diseases of liver: Secondary | ICD-10-CM | POA: Diagnosis not present

## 2024-03-17 DIAGNOSIS — K8020 Calculus of gallbladder without cholecystitis without obstruction: Secondary | ICD-10-CM | POA: Diagnosis not present

## 2024-03-17 MED ORDER — GADOBUTROL 1 MMOL/ML IV SOLN
9.0000 mL | Freq: Once | INTRAVENOUS | Status: AC | PRN
  Administered 2024-03-17: 9 mL via INTRAVENOUS

## 2024-03-20 ENCOUNTER — Ambulatory Visit: Payer: Self-pay | Admitting: Internal Medicine

## 2024-03-21 ENCOUNTER — Other Ambulatory Visit: Payer: Self-pay

## 2024-03-21 ENCOUNTER — Other Ambulatory Visit (HOSPITAL_COMMUNITY): Payer: Self-pay | Admitting: Student in an Organized Health Care Education/Training Program

## 2024-03-21 DIAGNOSIS — F411 Generalized anxiety disorder: Secondary | ICD-10-CM

## 2024-03-21 DIAGNOSIS — F332 Major depressive disorder, recurrent severe without psychotic features: Secondary | ICD-10-CM

## 2024-03-21 MED ORDER — VENLAFAXINE HCL ER 150 MG PO CP24
150.0000 mg | ORAL_CAPSULE | Freq: Every day | ORAL | 2 refills | Status: DC
  Filled 2024-03-21: qty 30, 30d supply, fill #0
  Filled 2024-04-26: qty 30, 30d supply, fill #1
  Filled 2024-05-27: qty 30, 30d supply, fill #2

## 2024-03-21 NOTE — Telephone Encounter (Signed)
 Received request from patient's pharmacy for refill of her Effexor  this was sent in.   Sent: -Effexor  XR 150 mg daily.  30 capsules with 2 refills.   Marolyn Rosser DO

## 2024-03-25 ENCOUNTER — Encounter (HOSPITAL_COMMUNITY): Payer: Self-pay

## 2024-03-25 ENCOUNTER — Ambulatory Visit (HOSPITAL_COMMUNITY): Admitting: Student in an Organized Health Care Education/Training Program

## 2024-03-25 ENCOUNTER — Other Ambulatory Visit: Payer: Self-pay

## 2024-04-03 ENCOUNTER — Other Ambulatory Visit: Payer: Self-pay

## 2024-04-03 ENCOUNTER — Other Ambulatory Visit: Payer: Self-pay | Admitting: Internal Medicine

## 2024-04-03 DIAGNOSIS — K7469 Other cirrhosis of liver: Secondary | ICD-10-CM

## 2024-04-08 ENCOUNTER — Other Ambulatory Visit (HOSPITAL_BASED_OUTPATIENT_CLINIC_OR_DEPARTMENT_OTHER): Payer: Self-pay

## 2024-04-08 ENCOUNTER — Encounter (HOSPITAL_BASED_OUTPATIENT_CLINIC_OR_DEPARTMENT_OTHER): Payer: Self-pay | Admitting: Pulmonary Disease

## 2024-04-08 ENCOUNTER — Ambulatory Visit (HOSPITAL_BASED_OUTPATIENT_CLINIC_OR_DEPARTMENT_OTHER): Admitting: Pulmonary Disease

## 2024-04-08 ENCOUNTER — Ambulatory Visit (INDEPENDENT_AMBULATORY_CARE_PROVIDER_SITE_OTHER): Admitting: Pulmonary Disease

## 2024-04-08 ENCOUNTER — Encounter (HOSPITAL_COMMUNITY): Payer: Self-pay | Admitting: Student in an Organized Health Care Education/Training Program

## 2024-04-08 ENCOUNTER — Ambulatory Visit (HOSPITAL_COMMUNITY): Admitting: Student in an Organized Health Care Education/Training Program

## 2024-04-08 ENCOUNTER — Ambulatory Visit (INDEPENDENT_AMBULATORY_CARE_PROVIDER_SITE_OTHER): Admitting: Student in an Organized Health Care Education/Training Program

## 2024-04-08 VITALS — BP 131/86 | HR 83 | Ht 65.0 in | Wt 217.5 lb

## 2024-04-08 DIAGNOSIS — Z87891 Personal history of nicotine dependence: Secondary | ICD-10-CM | POA: Diagnosis not present

## 2024-04-08 DIAGNOSIS — R0602 Shortness of breath: Secondary | ICD-10-CM

## 2024-04-08 DIAGNOSIS — J4530 Mild persistent asthma, uncomplicated: Secondary | ICD-10-CM

## 2024-04-08 DIAGNOSIS — F411 Generalized anxiety disorder: Secondary | ICD-10-CM

## 2024-04-08 DIAGNOSIS — R0683 Snoring: Secondary | ICD-10-CM

## 2024-04-08 DIAGNOSIS — J4489 Other specified chronic obstructive pulmonary disease: Secondary | ICD-10-CM

## 2024-04-08 DIAGNOSIS — F3341 Major depressive disorder, recurrent, in partial remission: Secondary | ICD-10-CM | POA: Diagnosis not present

## 2024-04-08 LAB — PULMONARY FUNCTION TEST
DL/VA % pred: 109 %
DL/VA: 4.61 ml/min/mmHg/L
DLCO unc % pred: 85 %
DLCO unc: 19.07 ml/min/mmHg
FEF 25-75 Post: 1.69 L/s
FEF 25-75 Pre: 1.41 L/s
FEF2575-%Change-Post: 19 %
FEF2575-%Pred-Post: 61 %
FEF2575-%Pred-Pre: 51 %
FEV1-%Change-Post: 4 %
FEV1-%Pred-Post: 69 %
FEV1-%Pred-Pre: 66 %
FEV1-Post: 2.03 L
FEV1-Pre: 1.94 L
FEV1FVC-%Change-Post: 3 %
FEV1FVC-%Pred-Pre: 93 %
FEV6-%Change-Post: 0 %
FEV6-%Pred-Post: 73 %
FEV6-%Pred-Pre: 72 %
FEV6-Post: 2.65 L
FEV6-Pre: 2.62 L
FEV6FVC-%Change-Post: 0 %
FEV6FVC-%Pred-Post: 102 %
FEV6FVC-%Pred-Pre: 102 %
FVC-%Change-Post: 1 %
FVC-%Pred-Post: 71 %
FVC-%Pred-Pre: 70 %
FVC-Post: 2.65 L
FVC-Pre: 2.62 L
Post FEV1/FVC ratio: 77 %
Post FEV6/FVC ratio: 100 %
Pre FEV1/FVC ratio: 74 %
Pre FEV6/FVC Ratio: 100 %
RV % pred: 147 %
RV: 2.86 L
TLC % pred: 102 %
TLC: 5.51 L

## 2024-04-08 MED ORDER — ALBUTEROL SULFATE HFA 108 (90 BASE) MCG/ACT IN AERS: 2.0000 | INHALATION_SPRAY | Freq: Four times a day (QID) | RESPIRATORY_TRACT | 2 refills | Status: DC | PRN

## 2024-04-08 NOTE — Patient Instructions (Signed)
  Asthmatic bronchitis --START albuterol 1-2 puffs AS NEEDED for shortness of breath or wheezing  Snoring --Home sleep test

## 2024-04-08 NOTE — Progress Notes (Signed)
 Subjective:   PATIENT ID: Sydney Mccoy GENDER: female DOB: 28-Apr-1971, MRN: [id number]  Chief Complaint  Patient presents with   Establish Care    Breathing trouble diagnosed with COPD in the past    Reason for Visit: New consult for COPD  Sydney Mccoy is a 53 year old female former smoker with COPD, cirrhosis, depression and anxiety who presents as a new patient for concern for COPD.  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 She denies frequent respiratory illnesses. She reports nonproductive cough daily, wheezing at night and shortness of breath with activity. Worsened with humidity. Associated with chest tightness. Symptoms sometimes worse at night and requires sitting up. She is concerned about her having sleep apnea with nighttime awakenings up to 3 times nightly. Her partner reports snoring. Reports her father and he passed away in his 50s.  Social History: Quit in 2021.  Smoked 1.5ppd x 30 years ~45 pack-years Vaped 3-4 years Previously smoked MJ daily. Quit >10 years ago Worked in homes as Social worker and preschools   I have personally reviewed patient's past medical/family/social history, allergies, current medications.  Past Medical History:  Diagnosis Date   Cirrhosis (HCC)    COPD (chronic obstructive pulmonary disease) (HCC)    Diverticulitis    Tachycardia      Family History  Problem Relation Age of Onset   Heart attack Father    Colon cancer Neg Hx    Esophageal cancer Neg Hx    Stomach cancer Neg Hx    Rectal cancer Neg Hx      Social History   Occupational History   Occupation: N/A  Tobacco Use   Smoking status: Former    Current packs/day: 0.00    Average packs/day: 0.5 packs/day for 33.0 years (16.5 ttl pk-yrs)    Types: Cigarettes    Start date: 32    Quit date: 2021    Years since quitting: 4.7   Smokeless tobacco: Never  Vaping Use   Vaping  status: Never Used  Substance and Sexual Activity   Alcohol use: Not Currently    Alcohol/week: 28.0 standard drinks of alcohol    Types: 28 Standard drinks or equivalent per week    Comment: 4 beers on the weekend - October 30, 2023 quit date   Drug use: Yes    Comment: CBD oil   Sexual activity: Not on file    No Known Allergies   Outpatient Medications Prior to Visit  Medication Sig Dispense Refill   hydrOXYzine  (ATARAX ) 25 MG tablet Take 1 tablet (25 mg total) by mouth 3 (three) times daily as needed. 30 tablet 1   mirtazapine  (REMERON ) 15 MG tablet Take 1 tablet (15 mg total) by mouth at bedtime. 30 tablet 1   omeprazole  (PRILOSEC) 40 MG capsule TAKE 1 CAPSULE(40 MG) BY MOUTH DAILY 90 capsule 1   propranolol  (INDERAL ) 20 MG tablet Take 1 tablet (20 mg total) by mouth 3 (three) times daily. 90 tablet 1   venlafaxine  XR (EFFEXOR  XR) 150 MG 24 hr capsule Take 1 capsule (150 mg total) by mouth daily. 30 capsule 2   clonazePAM  (KLONOPIN ) 1 MG tablet Take one tablet by mouth 1 hour prior to MRI. (Patient not taking: Reported on 04/08/2024) 1 tablet 0   No facility-administered medications prior to visit.    Review of Systems  Constitutional:  Negative for chills, diaphoresis,  fever, malaise/fatigue and weight loss.  HENT:  Negative for congestion.   Respiratory:  Positive for cough, shortness of breath and wheezing. Negative for hemoptysis and sputum production.   Cardiovascular:  Negative for chest pain, palpitations and leg swelling.     Objective:   Vitals:   04/08/24 0939  BP: 131/86  Pulse: 83  SpO2: 95%  Weight: 217 lb 8 oz (98.7 kg)  Height: 5' 5 (1.651 m)   SpO2: 95 %  Physical Exam: General: Well-appearing, no acute distress HENT: [place], AT Eyes: EOMI, no scleral icterus Respiratory: Clear to auscultation bilaterally.  No crackles, wheezing or rales Cardiovascular: RRR, -M/R/G, no JVD Extremities:-Edema,-tenderness Neuro: AAO x4, CNII-XII grossly intact Psych:  Normal mood, normal affect  Data Reviewed:  Imaging: CT A/P 01/16/23 - Visualized lung fields with no parenchymal abnormalities  PFT: 04/08/24 FVC 2.65 (71%) FEV1 2.03(69%) Ratio 77  TLC 102% RV 147% DLCO 85% Interpretation: No obstructive defect or restrictive defect. Normal gas exchange. Air trapping present but unclear significance in absence of obstruction.  Labs: CBC    Component Value Date/Time   WBC 4.1 11/14/2023 1114   RBC 4.20 11/14/2023 1114   HGB 12.9 11/14/2023 1114   HCT 38.5 11/14/2023 1114   PLT 123 (L) 11/14/2023 1114   MCV 91.7 11/14/2023 1114   MCH 30.7 11/14/2023 1114   MCHC 33.5 11/14/2023 1114   RDW 13.7 11/14/2023 1114   LYMPHSABS 1.7 07/17/2023 1217   MONOABS 0.3 07/17/2023 1217   EOSABS 0.1 07/17/2023 1217   BASOSABS 0.0 07/17/2023 1217        Assessment & Plan:   Discussion: 53 year old female former smoker with COPD, cirrhosis, depression and anxiety who presents as a new patient for concern for COPD. Coordinated with staff for PFTs on same day. PFTs performed in-clinic with no evidence of obstructive defect on spirometry but air trapping noted with unclear significance. Symptoms consistent with asthmatic bronchitis and reasonable to consider SABA and/or ICS/LABA for management of symptoms. Discussed clinical course and management of asthma including bronchodilator regimen, preventive care  and action plan for exacerbation.    Asthmatic bronchitis Mild persistent asthma Hx tobacco use --START albuterol 1-2 puffs AS NEEDED for shortness of breath or wheezing  Snoring --STOP BANG: High risk for age, BMI, snoring, witnessed apnea --Home sleep test ordered  Health Maintenance  There is no immunization history on file for this patient. CT Lung Screen - qualified when 53 years old  Orders Placed This Encounter  Procedures   Pulmonary function test    Standing Status:   Future    Number of Occurrences:   1    Expiration Date:   04/08/2025     Where should this test be performed?:   Outpatient Pulmonary    What type of PFT is being ordered?:   Full PFT   Home sleep test    Standing Status:   Future    Expiration Date:   04/08/2025    Where should this test be performed::   [facility]   Meds ordered this encounter  Medications   albuterol (VENTOLIN HFA) 108 (90 Base) MCG/ACT inhaler    Sig: Inhale 2 puffs into the lungs every 6 (six) hours as needed for wheezing or shortness of breath.    Dispense:  8 g    Refill:  2    Return in about 2 months (around 06/08/2024).  I have spent a total time of 60-minutes on the day  of the appointment reviewing prior documentation, coordinating care and discussing medical diagnosis and plan with the patient/family. Imaging, labs and tests included in this note have been reviewed and interpreted independently by me.  Porfiria Heinrich Slater Staff, MD [facility] 04/08/2024 9:55 AM

## 2024-04-08 NOTE — Progress Notes (Signed)
 Full PFT performed today.
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
## 2024-04-08 NOTE — Patient Instructions (Signed)
 Full PFT performed today.

## 2024-04-26 ENCOUNTER — Other Ambulatory Visit (HOSPITAL_COMMUNITY): Payer: Self-pay | Admitting: Student in an Organized Health Care Education/Training Program

## 2024-04-26 DIAGNOSIS — F411 Generalized anxiety disorder: Secondary | ICD-10-CM

## 2024-04-26 DIAGNOSIS — F410 Panic disorder [episodic paroxysmal anxiety] without agoraphobia: Secondary | ICD-10-CM

## 2024-04-26 DIAGNOSIS — F332 Major depressive disorder, recurrent severe without psychotic features: Secondary | ICD-10-CM

## 2024-04-28 ENCOUNTER — Other Ambulatory Visit: Payer: Self-pay

## 2024-04-29 ENCOUNTER — Other Ambulatory Visit: Payer: Self-pay

## 2024-04-29 MED ORDER — MIRTAZAPINE 15 MG PO TABS
15.0000 mg | ORAL_TABLET | Freq: Every day | ORAL | 1 refills | Status: DC
  Filled 2024-04-29: qty 30, 30d supply, fill #0
  Filled 2024-05-28: qty 30, 30d supply, fill #1

## 2024-04-29 NOTE — Telephone Encounter (Signed)
 Received message from patient's pharmacy for refill of her Remeron .  This was sent in.   Sent: Remeron  15 mg QHS.  30 tablets with 1 refill.    Marolyn Rosser DO

## 2024-05-01 ENCOUNTER — Other Ambulatory Visit: Payer: Self-pay

## 2024-05-06 ENCOUNTER — Ambulatory Visit (INDEPENDENT_AMBULATORY_CARE_PROVIDER_SITE_OTHER): Admitting: Student in an Organized Health Care Education/Training Program

## 2024-05-06 ENCOUNTER — Other Ambulatory Visit (HOSPITAL_COMMUNITY): Payer: Self-pay | Admitting: Student in an Organized Health Care Education/Training Program

## 2024-05-06 ENCOUNTER — Other Ambulatory Visit: Payer: Self-pay

## 2024-05-06 DIAGNOSIS — F410 Panic disorder [episodic paroxysmal anxiety] without agoraphobia: Secondary | ICD-10-CM | POA: Diagnosis not present

## 2024-05-06 DIAGNOSIS — F3341 Major depressive disorder, recurrent, in partial remission: Secondary | ICD-10-CM | POA: Diagnosis not present

## 2024-05-06 DIAGNOSIS — F411 Generalized anxiety disorder: Secondary | ICD-10-CM

## 2024-05-06 MED ORDER — PROPRANOLOL HCL 20 MG PO TABS
20.0000 mg | ORAL_TABLET | Freq: Three times a day (TID) | ORAL | 1 refills | Status: DC
  Filled 2024-05-06: qty 90, 30d supply, fill #0
  Filled 2024-06-03: qty 90, 30d supply, fill #1

## 2024-05-06 MED ORDER — QUETIAPINE FUMARATE 25 MG PO TABS
25.0000 mg | ORAL_TABLET | Freq: Every day | ORAL | 0 refills | Status: AC | PRN
  Filled 2024-05-06: qty 10, 5d supply, fill #0

## 2024-05-06 NOTE — Telephone Encounter (Signed)
 Received message from patient's pharmacy for refill of her Propanolol.  This was sent in.   Sent: -Propanolol 20 mg TID.  90 tablets with 1 refill.   Marolyn Rosser DO

## 2024-05-06 NOTE — Progress Notes (Signed)
 [facility] [address] [place] [address] Dept: [phone] Dept Fax: [phone]  Psychotherapy Progress Note  Patient ID: Sydney Mccoy, female  DOB: 11-May-1971, 53 y.o.  MRN: [id number]  05/06/2024 Start time: 2:28 AM End time: 3:18 AM  Method of Visit: Face-to-Face  Present: patient  Current Concerns:  She reports she did have another period of intense anxiety.  This time due to her and [name]'s high school reunion.  She reports that she was able to overall handled the situation relatively well.  She reports that previously she would have used alcohol to get through it but she was able to feel good about how she looked, how the evening went, and only had a single drink.  She reports that they did have Alex's white coat ceremony.  She reports that it was a good time but that it was also the first time she is seeing her ex-husband Shaun in a long time.  She reports that he looks much older than his father who is almost 59 and that he has gone very far right in his leaning's.  She reports that in the past she had wanted to see how Branson' parents would look in this situation but that now she just feels sad for them because of how bad he looks.  She reports she thinks this is most likely from her place of naivety but discussed with her that this is personal growth as you can know what people have done in the past but by choosing to still overlook it and have compassion for them is not being nave.  She reports that she did start her daily gratitude journal and has found that she is starting to give herself credit for some things.  Encouraged her to continue doing it.  Discussed with her what her next goal/step would be and she reports she wants to be in better shape.  Discussed setting small goals for herself like walking on the treadmill for set amount of time or walking around the neighborhood and she reports she will try to do  this.  Prior to leaving the appointment she confirmed she was in a stable and safe mindset.  She reports no SI, HI, or AVH.      Current Symptoms: Anxiety  Psychiatric Specialty Exam: General Appearance: Casual and Fairly Groomed  Eye Contact:  Good  Speech:  Clear and Coherent and Normal Rate  Volume:  Normal  Mood:  ok  Affect:  Congruent  Thought Process:  Coherent and Goal Directed  Orientation:  Full (Time, Place, and Person)  Thought Content:  WDL and Logical  Suicidal Thoughts:  No  Homicidal Thoughts:  No  Memory:  Immediate;   Good Recent;   Good  Judgement:  Good  Insight:  Good  Psychomotor Activity:  Normal  Concentration:  Concentration: Good and Attention Span: Good  Recall:  Good  Fund of Knowledge:Good  Language: Good  Akathisia:  Negative  Handed:  Right  AIMS (if indicated):  not done  Assets:  Communication Skills Desire for Improvement Housing Resilience Social Support  ADL's:  Intact  Cognition: WNL  Sleep:  Good     Diagnosis: GAD with Panic Disorder, MDD, Recurrent, Severe, w/out Psychosis, EtOH Abuse   Anticipated Frequency of Visits: every 2 weeks Anticipated Length of Treatment Episode: 20  Short Term Goals/Goals for Treatment Session: Begin daily positive affirmation/gratitude  Progress Towards Goals: Initial   Treatment Intervention: Insight-oriented therapy and  Supportive therapy  Medical Necessity: Assisted patient to achieve or maintain maximum functional capacity  Assessment Tools:    02/23/2023   10:51 AM 06/16/2022   10:15 AM 04/13/2022    9:39 AM  Depression screen PHQ 2/9  Decreased Interest 2 0 3  Down, Depressed, Hopeless 1 0 3  PHQ - 2 Score 3 0 6  Altered sleeping 0 0 3  Tired, decreased energy 2 2 3   Change in appetite 3 0 3  Feeling bad or failure about yourself  2 2 3   Trouble concentrating 0 3 3  Moving slowly or fidgety/restless 0 0 3  Suicidal thoughts 0 0 0  PHQ-9 Score 10 7 24   Difficult doing  work/chores  Somewhat difficult    Failed to redirect to the Timeline version of the REVFS SmartLink. Flowsheet Row US  BIOPSY [facility] & WL from 11/14/2023 in [facility] ULTRASOUND Most recent reading at 11/14/2023 11:29 AM ED from 01/16/2023 in [facility] at [facility] Most recent reading at 01/16/2023  4:10 PM UC from 01/16/2023 in [facility] at [facility] Most recent reading at 01/16/2023  2:53 PM  C-SSRS RISK CATEGORY No Risk No Risk No Risk    Collaboration of Care:   Patient/Guardian was advised Release of Information must be obtained prior to any record release in order to collaborate their care with an outside provider. Patient/Guardian was advised if they have not already done so to contact the registration department to sign all necessary forms in order for us  to release information regarding their care.   Consent: Patient/Guardian gives verbal consent for treatment and assignment of benefits for services provided during this visit. Patient/Guardian expressed understanding and agreed to proceed.   Plan: Provided talk/supportive therapy.  She has started work on her daily gratitude journal and is in the process of starting to exercise more.  She is beginning to give herself more credit instead of having automatic negative thoughts.  She is continuing to have some anxiety spikes but overall her day-to-day anxiety is improved.  Prior to leaving the appointment she confirmed she was in a stable and safe mindset.  She reports no SI, HI, or AVH.     GAD  MDD:  -Continue Effexor  XR 112.5 mg daily for depression and anxiety.  No refills sent at this time. -Continue Remeron  15 mg QHS for depression, andxiety, sleep, and appetite.  No refills sent at this time. -Continue Propanolol 20 mg TID for anxiety and HTN.  No refills sent at this time. -Continue Hydroxyzine  25 mg TID PRN for anxiety.  No refills sent at this time.    Marsa GORMAN Rosser, DO 05/06/2024

## 2024-05-07 ENCOUNTER — Other Ambulatory Visit: Payer: Self-pay

## 2024-05-15 ENCOUNTER — Encounter (HOSPITAL_BASED_OUTPATIENT_CLINIC_OR_DEPARTMENT_OTHER): Payer: Self-pay | Admitting: Family Medicine

## 2024-05-15 ENCOUNTER — Telehealth (HOSPITAL_BASED_OUTPATIENT_CLINIC_OR_DEPARTMENT_OTHER): Payer: Self-pay | Admitting: Pulmonary Disease

## 2024-05-15 ENCOUNTER — Ambulatory Visit (INDEPENDENT_AMBULATORY_CARE_PROVIDER_SITE_OTHER): Admitting: Family Medicine

## 2024-05-15 VITALS — BP 129/86 | HR 87 | Temp 98.7°F | Resp 18 | Ht 65.0 in | Wt 220.0 lb

## 2024-05-15 DIAGNOSIS — Z1231 Encounter for screening mammogram for malignant neoplasm of breast: Secondary | ICD-10-CM

## 2024-05-15 DIAGNOSIS — Z7689 Persons encountering health services in other specified circumstances: Secondary | ICD-10-CM

## 2024-05-15 DIAGNOSIS — Z124 Encounter for screening for malignant neoplasm of cervix: Secondary | ICD-10-CM

## 2024-05-15 DIAGNOSIS — E66812 Obesity, class 2: Secondary | ICD-10-CM | POA: Insufficient documentation

## 2024-05-15 DIAGNOSIS — Z23 Encounter for immunization: Secondary | ICD-10-CM | POA: Diagnosis not present

## 2024-05-15 DIAGNOSIS — Z0000 Encounter for general adult medical examination without abnormal findings: Secondary | ICD-10-CM

## 2024-05-15 MED ORDER — ZEPBOUND 2.5 MG/0.5ML ~~LOC~~ SOAJ: 2.5000 mg | SUBCUTANEOUS | 1 refills | Status: AC

## 2024-05-15 NOTE — Progress Notes (Signed)
 New Patient Office Visit  Subjective   Patient ID: Sydney Mccoy, female    DOB: August 04, 1970  Age: 53 y.o. MRN: [id number]  CC:  Chief Complaint  Patient presents with   Establish Care    HPI Sydney Mccoy presents to establish care Last PCP - [place] ago  She does follow with behavioral health specialist related to anxiety, MDD, panic disorder, alcohol abuse.  Current medications include mirtazapine , venlafaxine , quetiapine .  She has not had any recent changes in medications.  Reports good control of symptoms at present.  She also follows with gastroenterologist with history of liver cirrhosis, gallstones, history of colon polyps.  Currently sees Dr. Albertus.  She did recently establish with pulmonologist about a month ago for evaluation of COPD.  Patient does not smoke currently, but does have history of tobacco use.  She did smoke about 1-1/2 packs/day for about 30 years, did quit smoking a few years ago.  Patient is originally from [place]. Not working currently. She enjoys dancing, walking, spending time with family.  Outpatient Encounter Medications as of 05/15/2024  Medication Sig   albuterol  (VENTOLIN  HFA) 108 (90 Base) MCG/ACT inhaler Inhale 2 puffs into the lungs every 6 (six) hours as needed for wheezing or shortness of breath.   clonazePAM  (KLONOPIN ) 1 MG tablet Take one tablet by mouth 1 hour prior to MRI.   hydrOXYzine  (ATARAX ) 25 MG tablet Take 1 tablet (25 mg total) by mouth 3 (three) times daily as needed.   mirtazapine  (REMERON ) 15 MG tablet Take 1 tablet (15 mg total) by mouth at bedtime.   omeprazole  (PRILOSEC) 40 MG capsule TAKE 1 CAPSULE(40 MG) BY MOUTH DAILY   propranolol  (INDERAL ) 20 MG tablet Take 1 tablet (20 mg total) by mouth 3 (three) times daily.   tirzepatide  (ZEPBOUND ) 2.5 MG/0.5ML Pen Inject 2.5 mg into the skin once a week. (Patient not taking: Reported on 06/03/2024)   venlafaxine  XR (EFFEXOR  XR) 150 MG 24 hr capsule  Take 1 capsule (150 mg total) by mouth daily.   QUEtiapine  (SEROQUEL ) 25 MG tablet Take 1-2 tablets (25-50 mg total) by mouth daily as needed.   No facility-administered encounter medications on file as of 05/15/2024.    Past Medical History:  Diagnosis Date   Anxiety    Blood transfusion without reported diagnosis    Cataract    Cirrhosis (HCC)    COPD (chronic obstructive pulmonary disease) (HCC)    Depression    Diverticulitis    Tachycardia     Past Surgical History:  Procedure Laterality Date   ABDOMINAL HERNIA REPAIR  2009   ANKLE FRACTURE SURGERY  2012   COLON SURGERY     EYE SURGERY  2022   FRACTURE SURGERY     HERNIA REPAIR     INSERTION OF MESH     OOPHORECTOMY Left 2012   PATELLA FRACTURE SURGERY Left 2011    Family History  Problem Relation Age of Onset   Heart attack Father    Early death Father    Depression Maternal Grandmother    Miscarriages / Stillbirths Maternal Grandmother    Diabetes Paternal Grandfather    Vision loss Paternal Grandmother    Alcohol abuse Brother    Anxiety disorder Brother    Cancer Paternal Aunt    Colon cancer Neg Hx    Esophageal cancer Neg Hx    Stomach cancer Neg Hx    Rectal cancer Neg Hx     Social  History   Socioeconomic History   Marital status: Divorced    Spouse name: Not on file   Number of children: 1   Years of education: Not on file   Highest education level: Some college, no degree  Occupational History   Occupation: N/A  Tobacco Use   Smoking status: Former    Current packs/day: 0.00    Average packs/day: 0.9 packs/day for 55.0 years (49.5 ttl pk-yrs)    Types: Cigarettes    Start date: 7    Quit date: 2021    Years since quitting: 4.9   Smokeless tobacco: Never  Vaping Use   Vaping status: Never Used  Substance and Sexual Activity   Alcohol use: Not Currently    Alcohol/week: 28.0 standard drinks of alcohol    Types: 28 Standard drinks or equivalent per week    Comment: 4 beers on the  weekend - October 30, 2023 quit date   Drug use: Not Currently    Comment: CBD oil   Sexual activity: Yes    Birth control/protection: None  Other Topics Concern   Not on file  Social History Narrative   Not on file   Social Drivers of Health   Financial Resource Strain: Patient Declined (05/11/2024)   Overall Financial Resource Strain (CARDIA)    Difficulty of Paying Living Expenses: Patient declined  Food Insecurity: No Food Insecurity (05/11/2024)   Hunger Vital Sign    Worried About Running Out of Food in the Last Year: Never true    Ran Out of Food in the Last Year: Never true  Transportation Needs: No Transportation Needs (05/11/2024)   PRAPARE - Administrator, Civil Service (Medical): No    Lack of Transportation (Non-Medical): No  Physical Activity: Insufficiently Active (05/11/2024)   Exercise Vital Sign    Days of Exercise per Week: 2 days    Minutes of Exercise per Session: 20 min  Stress: Stress Concern Present (05/11/2024)   [employer] of Occupational Health - Occupational Stress Questionnaire    Feeling of Stress: Rather much  Social Connections: Socially Isolated (05/11/2024)   Social Connection and Isolation Panel    Frequency of Communication with Friends and Family: Three times a week    Frequency of Social Gatherings with Friends and Family: Patient declined    Attends Religious Services: Never    Database Administrator or Organizations: No    Attends Engineer, Structural: Not on file    Marital Status: Divorced  Catering Manager Violence: Not on file    Objective   BP 129/86 (BP Location: Left Arm, Patient Position: Sitting, Cuff Size: Large)   Pulse 87   Temp 98.7 F (37.1 C) (Oral)   Resp 18   Ht 5' 5 (1.651 m)   Wt 220 lb (99.8 kg)   LMP 03/25/2014 (Approximate)   SpO2 96%   BMI 36.61 kg/m   Physical Exam  53 year old female in no acute distress Cardiovascular exam regular rate and rhythm Lungs clear to  auscultation bilaterally  Assessment & Plan:   Obesity, Class II, BMI 35-39.9 Assessment & Plan: Long discussion today reviewing weight loss medications including injectable options including GLP-1 receptor agonist, oral agents.  We discussed potential risk, benefits, cost associated with these various medications as well as the possibility of insurance coverage or lack thereof.  We discussed typical dosing regimen for both injectable and oral medications, proper administration of each.  We discussed potential outcomes with each. Patient notes  that she has tried various interventions including lifestyle modifications,  After discussion of potential risks and adverse reactions with these medications and potential benefits of each, patient would like to proceed with injectable GLP-1 receptor agonist if possible.  Prescription sent to pharmacy on file.  Will plan for close follow-up to monitor response to whichever medication patient is able to initiate. Additional consideration is for referral to healthy weight and wellness clinic  Orders: -     Zepbound ; Inject 2.5 mg into the skin once a week. (Patient not taking: Reported on 06/03/2024)  Dispense: 2 mL; Refill: 1  Immunization due -     Flu vaccine trivalent PF, 6mos and older(Flulaval,Afluria,Fluarix,Fluzone)  Encounter for screening mammogram for malignant neoplasm of breast -     3D Screening Mammogram, Left and Right; Future  Wellness examination -     CBC with Differential/Platelet; Future -     Comprehensive metabolic panel with GFR; Future -     Hemoglobin A1c; Future -     Lipid panel; Future -     TSH Rfx on Abnormal to Free T4; Future  Cervical cancer screening -     Ambulatory referral to Obstetrics / Gynecology  Encounter to establish care -     Ambulatory referral to Obstetrics / Gynecology  Return in about 2 months (around 07/15/2024) for CPE with fasting labs 1 week prior.     ___________________________________________ Jake Fuhrmann de [place], MD, ABFM, CAQSM Primary Care and Sports Medicine [facility]

## 2024-05-15 NOTE — Patient Instructions (Signed)
  Medication Instructions:  Your physician recommends that you continue on your current medications as directed. Please refer to the Current Medication list given to you today. --If you need a refill on any your medications before your next appointment, please call your pharmacy first. If no refills are authorized on file call the office.-- Lab Work: Your physician has recommended that you have lab work today: 1 week before physical If you have labs (blood work) drawn today and your tests are completely normal, you will receive your results via MyChart message OR a phone call from our staff.  Please ensure you check your voicemail in the event that you authorized detailed messages to be left on a delegated number. If you have any lab test that is abnormal or we need to change your treatment, we will call you to review the results.  Referrals/Procedures/Imaging: no  Follow-Up: Your next appointment:   Your physician recommends that you schedule a follow-up appointment in: 2-3 months for physical with Dr. de Cuba  You will receive a text message or e-mail with a link to a survey about your care and experience with us  today! We would greatly appreciate your feedback!   Thanks for letting us  be apart of your health journey!!  Primary Care and Sports Medicine   Dr. Quintin sheerer Cuba   We encourage you to activate your patient portal called MyChart.  Sign up information is provided on this After Visit Summary.  MyChart is used to connect with patients for Virtual Visits (Telemedicine).  Patients are able to view lab/test results, encounter notes, upcoming appointments, etc.  Non-urgent messages can be sent to your provider as well. To learn more about what you can do with MyChart, please visit --  [email or web address].

## 2024-05-15 NOTE — Telephone Encounter (Signed)
 Called patient left voicemail requesting a call back to schedule HST. Nothing further needed at this time.

## 2024-05-15 NOTE — Telephone Encounter (Signed)
 Patient presented in office. She was seen 04/08/24 and JE ordered an HST. Patient has not been contacted about this yet. She has upcoming visit 11/25 and would like to complete prior if possible. Please contact patient to schedule this.

## 2024-05-15 NOTE — Assessment & Plan Note (Signed)
 Long discussion today reviewing weight loss medications including injectable options including GLP-1 receptor agonist, oral agents.  We discussed potential risk, benefits, cost associated with these various medications as well as the possibility of insurance coverage or lack thereof.  We discussed typical dosing regimen for both injectable and oral medications, proper administration of each.  We discussed potential outcomes with each. Patient notes that she has tried various interventions including lifestyle modifications,  After discussion of potential risks and adverse reactions with these medications and potential benefits of each, patient would like to proceed with injectable GLP-1 receptor agonist if possible.  Prescription sent to pharmacy on file.  Will plan for close follow-up to monitor response to whichever medication patient is able to initiate. Additional consideration is for referral to healthy weight and wellness clinic

## 2024-05-16 ENCOUNTER — Other Ambulatory Visit (HOSPITAL_COMMUNITY): Payer: Self-pay

## 2024-05-16 ENCOUNTER — Telehealth (HOSPITAL_BASED_OUTPATIENT_CLINIC_OR_DEPARTMENT_OTHER): Payer: Self-pay | Admitting: Pharmacy Technician

## 2024-05-16 NOTE — Telephone Encounter (Signed)
 Pharmacy Patient Advocate Encounter   Received notification from Onbase that prior authorization for Zepbound 2.5mg  is required/requested.   Insurance verification completed.   The patient is insured through [facility] MEDICAID.  Effective October 1st, Medicaid discontinued coverage of GLP1 medications for weight loss (such as Wegovy and Zepbound), unless the patient has a documented history of a heart attack or stroke is age 53 or older and a BMI greater than 40 for coverage for [facility] Zepbound will continue to be covered only for patients with moderate to severe sleep apnea (AHI 15-30) and a BMI greater than 40.

## 2024-05-17 NOTE — Telephone Encounter (Signed)
 Sent mychart message to pt of the response about the PA.

## 2024-05-20 ENCOUNTER — Ambulatory Visit (INDEPENDENT_AMBULATORY_CARE_PROVIDER_SITE_OTHER): Admitting: Student in an Organized Health Care Education/Training Program

## 2024-05-20 ENCOUNTER — Encounter (HOSPITAL_COMMUNITY): Payer: Self-pay | Admitting: Student in an Organized Health Care Education/Training Program

## 2024-05-20 DIAGNOSIS — F411 Generalized anxiety disorder: Secondary | ICD-10-CM | POA: Diagnosis not present

## 2024-05-20 DIAGNOSIS — F3341 Major depressive disorder, recurrent, in partial remission: Secondary | ICD-10-CM

## 2024-05-20 NOTE — Telephone Encounter (Signed)
 Sydney Mccoy returned the call and successfully scheduled the HST. Confirmation provided. Appointment reminder has been printed and mailed. Nothing else further needed.

## 2024-05-20 NOTE — Progress Notes (Signed)
 [facility] [address] [place] [address] Dept: [phone] Dept Fax: [phone]  Psychotherapy Progress Note  Patient ID: Sydney Mccoy, female  DOB: October 07, 1970, 53 y.o.  MRN: [id number]  05/20/2024 Start time: 3:31 AM End time: 4:50 AM  Method of Visit: Face-to-Face  Present: patient  Current Concerns:  She reports she has been ok since her last appointment.  She reports that she has meet her new Family Medicine Dr and also been set up with OB/GYN and has a sleep study at home for next week.  She reports there is some stress as Ale's GF's mother is about to be undergoing a Whipple Procedure and knows that there are low odds of survival with this.    She reports that [name]'s dad is going to do Thanksgiving and this will be interesting due to the first time since [name]'s mother passed.  She reports she has been working on her daily gratitude journal and does it about 50% of the time.  She reports she plans to get a new journal that has Inspirational Shit My Therapist Made Me Write written on it that she saw the other day.  Encouraged her to continue working on starting exercise.  Prior to leaving the appointment she confirmed she was in a stable and safe mindset.  She reports no SI, HI, or AVH.       Current Symptoms: Family Stress  Psychiatric Specialty Exam: General Appearance: Casual and Fairly Groomed  Eye Contact:  Good  Speech:  Clear and Coherent and Normal Rate  Volume:  Normal  Mood:  ok  Affect:  Congruent  Thought Process:  Coherent and Goal Directed  Orientation:  Full (Time, Place, and Person)  Thought Content:  WDL and Logical  Suicidal Thoughts:  No  Homicidal Thoughts:  No  Memory:  Immediate;   Good Recent;   Good  Judgement:  Good  Insight:  Good  Psychomotor Activity:  Normal  Concentration:  Concentration: Good and Attention Span: Good  Recall:  Good  Fund of Knowledge:Good   Language: Good  Akathisia:  Negative  Handed:  Right  AIMS (if indicated):  not done  Assets:  Communication Skills Desire for Improvement Housing Resilience Social Support  ADL's:  Intact  Cognition: WNL  Sleep:  Good     Diagnosis: GAD with Panic Disorder, MDD, Recurrent, Severe, w/out Psychosis, EtOH Abuse   Anticipated Frequency of Visits: every 2 weeks Anticipated Length of Treatment Episode: 20  Short Term Goals/Goals for Treatment Session: Continue daily positive affirmation/gratitude, start exercise program Progress Towards Goals: Progressing as she is writing about 50% of the time.   Treatment Intervention: Insight-oriented therapy and Supportive therapy  Medical Necessity: Assisted patient to achieve or maintain maximum functional capacity  Assessment Tools:    02/23/2023   10:51 AM 06/16/2022   10:15 AM 04/13/2022    9:39 AM  Depression screen PHQ 2/9  Decreased Interest 2 0 3  Down, Depressed, Hopeless 1 0 3  PHQ - 2 Score 3 0 6  Altered sleeping 0 0 3  Tired, decreased energy 2 2 3   Change in appetite 3 0 3  Feeling bad or failure about yourself  2 2 3   Trouble concentrating 0 3 3  Moving slowly or fidgety/restless 0 0 3  Suicidal thoughts 0 0 0  PHQ-9 Score 10  7  24    Difficult doing work/chores  Somewhat difficult      Data saved  with a previous flowsheet row definition   Failed to redirect to the Timeline version of the REVFS SmartLink. Flowsheet Row US  BIOPSY MC & WL from 11/14/2023 in [facility] ULTRASOUND Most recent reading at 11/14/2023 11:29 AM ED from 01/16/2023 in [facility] Department at [facility] Most recent reading at 01/16/2023  4:10 PM UC from 01/16/2023 in [facility] Care at [facility] Most recent reading at 01/16/2023  2:53 PM  C-SSRS RISK CATEGORY No Risk No Risk No Risk    Collaboration of Care:   Patient/Guardian was advised Release of Information must be obtained prior to any record  release in order to collaborate their care with an outside provider. Patient/Guardian was advised if they have not already done so to contact the registration department to sign all necessary forms in order for us  to release information regarding their care.   Consent: Patient/Guardian gives verbal consent for treatment and assignment of benefits for services provided during this visit. Patient/Guardian expressed understanding and agreed to proceed.   Plan: Provided talk/supportive therapy.  She has been working on her journaling and addressing her health.  She will continue working on starting an exercise program.  Prior to leaving the appointment she confirmed she was in a stable and safe mindset.  She reports no SI, HI, or AVH.    GAD  MDD:  -Continue Effexor  XR 112.5 mg daily for depression and anxiety.  No refills sent at this time. -Continue Remeron  15 mg QHS for depression, andxiety, sleep, and appetite.  No refills sent at this time. -Continue Propanolol 20 mg TID for anxiety and HTN.  No refills sent at this time. -Continue Hydroxyzine  25 mg TID PRN for anxiety.  No refills sent at this time.    Marsa GORMAN Rosser, DO 05/20/2024

## 2024-05-27 ENCOUNTER — Encounter (HOSPITAL_BASED_OUTPATIENT_CLINIC_OR_DEPARTMENT_OTHER)

## 2024-05-30 ENCOUNTER — Other Ambulatory Visit: Payer: Self-pay

## 2024-06-02 ENCOUNTER — Telehealth: Payer: Self-pay | Admitting: Pulmonary Disease

## 2024-06-02 DIAGNOSIS — G4733 Obstructive sleep apnea (adult) (pediatric): Secondary | ICD-10-CM | POA: Diagnosis not present

## 2024-06-02 NOTE — Telephone Encounter (Signed)
 Call patient  Sleep study result  Date of study: 05/27/2024  Impression: Severe obstructive sleep apnea with AHI of 49.9 with O2 nadir of 82% Severe hypoxic stress of 45 is consistent with severe physiologic stress and places patient at increased cardiovascular risk  Recommendation: Recommend CPAP therapy for severe obstructive sleep apnea Auto CPAP 5-20 with heated humidification with patient's mask of choice   Close clinical follow-up optimization of treatment

## 2024-06-03 ENCOUNTER — Ambulatory Visit (INDEPENDENT_AMBULATORY_CARE_PROVIDER_SITE_OTHER): Admitting: Pulmonary Disease

## 2024-06-03 ENCOUNTER — Encounter (HOSPITAL_BASED_OUTPATIENT_CLINIC_OR_DEPARTMENT_OTHER): Payer: Self-pay | Admitting: Pulmonary Disease

## 2024-06-03 ENCOUNTER — Other Ambulatory Visit (HOSPITAL_COMMUNITY): Payer: Self-pay

## 2024-06-03 VITALS — BP 130/85 | HR 80 | Temp 98.4°F | Ht 65.0 in | Wt 224.4 lb

## 2024-06-03 DIAGNOSIS — R0602 Shortness of breath: Secondary | ICD-10-CM | POA: Diagnosis not present

## 2024-06-03 DIAGNOSIS — G4733 Obstructive sleep apnea (adult) (pediatric): Secondary | ICD-10-CM | POA: Diagnosis not present

## 2024-06-03 NOTE — Telephone Encounter (Signed)
 Pt had sleep study performed which shows she has severe OSA. Due to this, can we try to resubmit the prior authorization for GLP-1 medication along with the sleep study results to see if we can get medication approved for patient?

## 2024-06-03 NOTE — Patient Instructions (Addendum)
 Severe OSA --Counseled on sleep hygiene --Counseled on weight loss/maintenance of healthy weight  >Will contact PCP for weight loss med options --Counseled NOT to drive if/when sleepy --Advised patient to wear CPAP for at least 4 hours each night for greater than 70% of the time to avoid the machine being repossessed by insurance. --ORDER CPAP 5-20 cm H20 with humidifier

## 2024-06-03 NOTE — Progress Notes (Signed)
 Subjective:   PATIENT ID: Sydney Mccoy GENDER: female DOB: [birth year], MRN: [id number]  Chief Complaint  Patient presents with   Shortness of Breath    Reason for Visit: Follow-up    Ms. 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 She denies frequent respiratory illnesses. She reports nonproductive cough daily, wheezing at night and shortness of breath with activity. Worsened with humidity. Associated with chest tightness. Symptoms sometimes worse at night and requires sitting up. She is concerned about her having sleep apnea with nighttime awakenings up to 3 times nightly. Her partner reports snoring. Reports her father and he passed away in his 57s.    2024/06/19 She has completed home sleep study 05/27/24. Demonstrates sleep apnea. She reports albuterol  four times a day with improvement with cough and shortness of breath. Denies wheezing. Wanting to lose weight but recently denied for injections.  Social History: Quit in 2021.  Smoked 1.5ppd x 30 years ~45 pack-years Vaped 3-4 years Previously smoked MJ daily. Quit >10 years ago Worked in homes as social worker and preschools   Past Medical History:  Diagnosis Date   Anxiety    Blood transfusion without reported diagnosis    Cataract    Cirrhosis (HCC)    COPD (chronic obstructive pulmonary disease) (HCC)    Depression    Diverticulitis    Tachycardia      Family History  Problem Relation Age of Onset   Heart attack Father    Early death Father    Depression Maternal Grandmother    Miscarriages / Stillbirths Maternal Grandmother    Diabetes Paternal Grandfather    Vision loss Paternal Grandmother    Alcohol abuse Brother    Anxiety disorder Brother    Cancer Paternal Aunt    Colon cancer  Neg Hx    Esophageal cancer Neg Hx    Stomach cancer Neg Hx    Rectal cancer Neg Hx      Social History   Occupational History   Occupation: N/A  Tobacco Use   Smoking status: Former    Current packs/day: 0.00    Average packs/day: 0.9 packs/day for 55.0 years (49.5 ttl pk-yrs)    Types: Cigarettes    Start date: 20    Quit date: 2021    Years since quitting: 4.9   Smokeless tobacco: Never  Vaping Use   Vaping status: Never Used  Substance and Sexual Activity   Alcohol use: Not Currently    Alcohol/week: 28.0 standard drinks of alcohol    Types: 28 Standard drinks or equivalent per week    Comment: 4 beers on the weekend - October 30, 2023 quit date   Drug use: Not Currently    Comment: CBD oil   Sexual activity: Yes    Birth control/protection: None    No Known Allergies   Outpatient Medications Prior to Visit  Medication Sig Dispense Refill   albuterol  (VENTOLIN  HFA) 108 (90 Base) MCG/ACT inhaler Inhale 2 puffs into the lungs every 6 (six) hours as needed for wheezing or shortness of breath. 8 g 2   clonazePAM  (KLONOPIN ) 1 MG tablet Take one tablet by mouth 1 hour prior to MRI. 1 tablet 0   hydrOXYzine  (ATARAX ) 25 MG tablet Take 1 tablet (25  mg total) by mouth 3 (three) times daily as needed. 30 tablet 1   mirtazapine  (REMERON ) 15 MG tablet Take 1 tablet (15 mg total) by mouth at bedtime. 30 tablet 1   omeprazole  (PRILOSEC) 40 MG capsule TAKE 1 CAPSULE(40 MG) BY MOUTH DAILY 90 capsule 1   propranolol  (INDERAL ) 20 MG tablet Take 1 tablet (20 mg total) by mouth 3 (three) times daily. 90 tablet 1   QUEtiapine  (SEROQUEL ) 25 MG tablet Take 1-2 tablets (25-50 mg total) by mouth daily as needed. 10 tablet 0   venlafaxine  XR (EFFEXOR  XR) 150 MG 24 hr capsule Take 1 capsule (150 mg total) by mouth daily. 30 capsule 2   tirzepatide  (ZEPBOUND ) 2.5 MG/0.5ML Pen Inject 2.5 mg into the skin once a week. (Patient not taking: Reported on 06/03/2024) 2 mL 1   No facility-administered  medications prior to visit.    Review of Systems  Constitutional:  Negative for chills, diaphoresis, fever, malaise/fatigue and weight loss.  HENT:  Negative for congestion.   Respiratory:  Negative for cough, hemoptysis, sputum production, shortness of breath and wheezing.   Cardiovascular:  Negative for chest pain, palpitations and leg swelling.     Objective:   Vitals:   06/03/24 1008  BP: 130/85  Pulse: 80  Temp: 98.4 F (36.9 C)  SpO2: 96%  Weight: 224 lb 6.4 oz (101.8 kg)  Height: 5' 5 (1.651 m)    SpO2: 96 %  Physical Exam: General: Well-appearing, no acute distress HENT: [place], AT Eyes: EOMI, no scleral icterus Respiratory: Clear to auscultation bilaterally.  No crackles, wheezing or rales Cardiovascular: RRR, -M/R/G, no JVD Extremities:-Edema,-tenderness Neuro: AAO x4, CNII-XII grossly intact Psych: Normal mood, normal affect   Data Reviewed:  Imaging: CT A/P 01/16/23 - Visualized lung fields with no parenchymal abnormalities  PFT: 04/08/24 FVC 2.65 (71%) FEV1 2.03(69%) Ratio 77  TLC 102% RV 147% DLCO 85% Interpretation: No obstructive defect or restrictive defect. Normal gas exchange. Air trapping present but unclear significance in absence of obstruction.  Sleep: HST 05/27/24 - Severe obstructive sleep apnea with AHI of 49.9 with O2 nadir of 82%   Labs: CBC    Component Value Date/Time   WBC 4.1 11/14/2023 1114   RBC 4.20 11/14/2023 1114   HGB 12.9 11/14/2023 1114   HCT 38.5 11/14/2023 1114   PLT 123 (L) 11/14/2023 1114   MCV 91.7 11/14/2023 1114   MCH 30.7 11/14/2023 1114   MCHC 33.5 11/14/2023 1114   RDW 13.7 11/14/2023 1114   LYMPHSABS 1.7 07/17/2023 1217   MONOABS 0.3 07/17/2023 1217   EOSABS 0.1 07/17/2023 1217   BASOSABS 0.0 07/17/2023 1217        Assessment & Plan:   Discussion: 53 year old female former smoker with COPD, cirrhosis, depression and anxiety who presents for follow-up for OSA.Reviewed sleep study and discussed  benefits of CPAP therapy. Not a candidate for Inspire. Discussed weight loss goals (BMI <35, 210 lbs).     Asthmatic bronchitis Mild persistent asthma Hx tobacco use --CONTINUE Albuterol  1-2 puffs AS NEEDED for shortness of breath or wheezing --Consider maintenance inhalers in the future if still needing albuterol  daily  Severe OSA The natural history, progression and prognosis of sleep apnea, treatment with PAP and alternative treatment strategies were discussed. The patient was also educated regarding the long term cardiovascular benefits of treating sleep apnea, including improved blood pressure control, reduction in MI and stroke risk as well as other potential benefits of treatment, such as improved glycemic  control, facilitation of weight loss, improved energy during the day and improved sleep quality. --Counseled on sleep hygiene --Counseled on weight loss/maintenance of healthy weight. Will contact PCP for weight loss med options --Counseled NOT to drive if/when sleepy --Advised patient to wear CPAP for at least 4 hours each night for greater than 70% of the time to avoid the machine being repossessed by insurance. --ORDER CPAP 5-20 cm H20 with humidifier  Health Maintenance Immunization History  Administered Date(s) Administered   Influenza, Seasonal, Injecte, Preservative Fre 05/15/2024   CT Lung Screen - qualified when 53 years old  Orders Placed This Encounter  Procedures   Ambulatory Referral for DME    Referral Priority:   Routine    Referral Type:   Durable Medical Equipment Purchase    Number of Visits Requested:   1   No orders of the defined types were placed in this encounter.   Return in about 3 months (around 09/03/2024).  I have spent a total time of 35-minutes on the day of the appointment including chart review, data review, collecting history, coordinating care and discussing medical diagnosis and plan with the patient/family. Past medical history,  allergies, medications were reviewed. Pertinent imaging, labs and tests included in this note have been reviewed and interpreted independently by me.  Eeshan Verbrugge Slater Staff, MD [facility] 06/03/2024 11:56 AM

## 2024-06-03 NOTE — Telephone Encounter (Signed)
 Results were discussed with patient in clinic 06/03/24. CPAP supplies ordered. Closing encounter.

## 2024-06-04 NOTE — Telephone Encounter (Signed)
 Additional information has been requested by plan to proceed with processing of the prior authorization. Additional information has been completed and faxed back to the plan today at 2:50pm.   Fax# [phone]

## 2024-06-06 NOTE — Telephone Encounter (Signed)
 Pharmacy Patient Advocate Encounter  Received notification from [facility] MEDICAID that Prior Authorization for Zepbound  2.5mg   has been DENIED.  Full denial letter will be uploaded to the media tab. See denial reason below.   PA #/Case ID/Reference #: [id number]  Baseline BMI must be 40 or above.

## 2024-06-09 ENCOUNTER — Ambulatory Visit (HOSPITAL_COMMUNITY): Admitting: Student in an Organized Health Care Education/Training Program

## 2024-06-09 ENCOUNTER — Ambulatory Visit

## 2024-06-09 NOTE — Progress Notes (Unsigned)
 [facility] [address] [place] [address] Dept: [phone] Dept Fax: [phone]  Psychotherapy Progress Note  Patient ID: Sydney Mccoy, female  DOB: 02/20/1971, 53 y.o.  MRN: [id number]  06/09/2024 Start time: 1:*** PM End time: 1:*** PM  Method of Visit: Video: Virtual Visit via Video Encounter:  I connected with Sydney Mccoy on 06/09/24 at  1:00 PM EST by a video enabled telemedicine application and verified that I am speaking with the correct person using two identifiers.   Location: Patient: *** Provider: Home Office   I discussed the limitations of evaluation and management by telemedicine and the availability of in person appointments. The patient expressed understanding and agreed to proceed.  I discussed the assessment and treatment plan with the patient. The patient was provided an opportunity to ask questions and all were answered. The patient agreed with the plan and demonstrated an understanding of the instructions.   The patient was advised to call back or seek an in-person evaluation if the symptoms worsen or if the condition fails to improve as anticipated.  I provided *** minutes of non-face-to-face time during this encounter.  Present: patient  Current Concerns:  She reports ***       Current Symptoms: Family Stress *** Psychiatric Specialty Exam: General Appearance: Casual and Fairly Groomed  Eye Contact:  Good  Speech:  Clear and Coherent and Normal Rate  Volume:  Normal  Mood:  ok  Affect:  Congruent  Thought Process:  Coherent and Goal Directed  Orientation:  Full (Time, Place, and Person)  Thought Content:  WDL and Logical  Suicidal Thoughts:  No  Homicidal Thoughts:  No  Memory:  Immediate;   Good Recent;   Good  Judgement:  Good  Insight:  Good  Psychomotor Activity:  Normal  Concentration:  Concentration: Good and Attention Span: Good  Recall:  Good  Fund of  Knowledge:Good  Language: Good  Akathisia:  Negative  Handed:  Right  AIMS (if indicated):  not done  Assets:  Communication Skills Desire for Improvement Housing Resilience Social Support  ADL's:  Intact  Cognition: WNL  Sleep:  Good     Diagnosis: GAD with Panic Disorder, MDD, Recurrent, Severe, w/out Psychosis, EtOH Abuse   Anticipated Frequency of Visits: every 2 weeks Anticipated Length of Treatment Episode: 20  Short Term Goals/Goals for Treatment Session: Continue daily positive affirmation/gratitude, start exercise program *** Progress Towards Goals: Progressing as she is writing about 50% of the time.   Treatment Intervention: Insight-oriented therapy and Supportive therapy  Medical Necessity: Assisted patient to achieve or maintain maximum functional capacity  Assessment Tools:    02/23/2023   10:51 AM 06/16/2022   10:15 AM 04/13/2022    9:39 AM  Depression screen PHQ 2/9  Decreased Interest 2 0 3  Down, Depressed, Hopeless 1 0 3  PHQ - 2 Score 3 0 6  Altered sleeping 0 0 3  Tired, decreased energy 2 2 3   Change in appetite 3 0 3  Feeling bad or failure about yourself  2 2 3   Trouble concentrating 0 3 3  Moving slowly or fidgety/restless 0 0 3  Suicidal thoughts 0 0 0  PHQ-9 Score 10  7  24    Difficult doing work/chores  Somewhat difficult      Data saved with a previous flowsheet row definition   Failed to redirect to the Timeline version of the REVFS SmartLink. Flowsheet Row US  BIOPSY MC &  WL from 11/14/2023 in [facility] ULTRASOUND Most recent reading at 11/14/2023 11:29 AM ED from 01/16/2023 in [facility] Emergency Department at [facility] Most recent reading at 01/16/2023  4:10 PM UC from 01/16/2023 in [facility] Urgent Care at [facility] Most recent reading at 01/16/2023  2:53 PM  C-SSRS RISK CATEGORY No Risk No Risk No Risk    Collaboration of Care:   Patient/Guardian was advised Release of Information must be obtained prior  to any record release in order to collaborate their care with an outside provider. Patient/Guardian was advised if they have not already done so to contact the registration department to sign all necessary forms in order for us  to release information regarding their care.   Consent: Patient/Guardian gives verbal consent for treatment and assignment of benefits for services provided during this visit. Patient/Guardian expressed understanding and agreed to proceed.   Plan: Provided talk/supportive therapy.  She has ***    GAD  MDD:  -Continue Effexor  XR 112.5 mg daily for depression and anxiety.  No refills sent at this time. -Continue Remeron  15 mg QHS for depression, andxiety, sleep, and appetite.  No refills sent at this time. -Continue Propanolol 20 mg TID for anxiety and HTN.  No refills sent at this time. -Continue Hydroxyzine  25 mg TID PRN for anxiety.  No refills sent at this time.    Sydney GORMAN Rosser, DO 06/09/2024

## 2024-06-22 ENCOUNTER — Other Ambulatory Visit (HOSPITAL_COMMUNITY): Payer: Self-pay | Admitting: Student in an Organized Health Care Education/Training Program

## 2024-06-22 DIAGNOSIS — F332 Major depressive disorder, recurrent severe without psychotic features: Secondary | ICD-10-CM

## 2024-06-22 DIAGNOSIS — F411 Generalized anxiety disorder: Secondary | ICD-10-CM

## 2024-06-24 ENCOUNTER — Other Ambulatory Visit: Payer: Self-pay

## 2024-06-24 MED ORDER — VENLAFAXINE HCL ER 150 MG PO CP24
150.0000 mg | ORAL_CAPSULE | Freq: Every day | ORAL | 2 refills | Status: AC
  Filled 2024-06-24: qty 30, 30d supply, fill #0
  Filled 2024-07-26: qty 30, 30d supply, fill #1

## 2024-06-24 NOTE — Telephone Encounter (Signed)
 Received message from patients pharmacy for refill of her Effexor .  This was sent in.    Sent: -Effexor  XR 150 mg daily. 30 capsules with 2 refills.      Marolyn Rosser DO

## 2024-06-25 ENCOUNTER — Other Ambulatory Visit (HOSPITAL_COMMUNITY): Payer: Self-pay | Admitting: Student in an Organized Health Care Education/Training Program

## 2024-06-25 ENCOUNTER — Other Ambulatory Visit: Payer: Self-pay

## 2024-06-25 DIAGNOSIS — F411 Generalized anxiety disorder: Secondary | ICD-10-CM

## 2024-06-25 DIAGNOSIS — F332 Major depressive disorder, recurrent severe without psychotic features: Secondary | ICD-10-CM

## 2024-06-25 DIAGNOSIS — F410 Panic disorder [episodic paroxysmal anxiety] without agoraphobia: Secondary | ICD-10-CM

## 2024-06-25 MED ORDER — MIRTAZAPINE 15 MG PO TABS
15.0000 mg | ORAL_TABLET | Freq: Every day | ORAL | 1 refills | Status: AC
  Filled 2024-06-25: qty 30, 30d supply, fill #0
  Filled 2024-07-26: qty 30, 30d supply, fill #1

## 2024-06-25 NOTE — Telephone Encounter (Signed)
 Received message from patient's pharmacy for refill of her Remeron .  This was sent.   Sent: -Remeron  15 mg QHS.  30 tablets with 1 refill.    Marolyn Rosser DO

## 2024-06-29 ENCOUNTER — Other Ambulatory Visit (HOSPITAL_BASED_OUTPATIENT_CLINIC_OR_DEPARTMENT_OTHER): Payer: Self-pay | Admitting: Pulmonary Disease

## 2024-06-29 ENCOUNTER — Other Ambulatory Visit (HOSPITAL_COMMUNITY): Payer: Self-pay | Admitting: Student in an Organized Health Care Education/Training Program

## 2024-06-29 DIAGNOSIS — F410 Panic disorder [episodic paroxysmal anxiety] without agoraphobia: Secondary | ICD-10-CM

## 2024-06-29 DIAGNOSIS — F411 Generalized anxiety disorder: Secondary | ICD-10-CM

## 2024-06-29 MED ORDER — PROPRANOLOL HCL 20 MG PO TABS
20.0000 mg | ORAL_TABLET | Freq: Three times a day (TID) | ORAL | 1 refills | Status: AC
  Filled 2024-06-29: qty 90, 30d supply, fill #0
  Filled 2024-08-06: qty 90, 30d supply, fill #1

## 2024-06-29 NOTE — Telephone Encounter (Signed)
 Received message from patient's pharmacy that she needed a refill of her Propanolol.  This was sent.    Sent: -Propanolol 20 mg TID.  90 tablets with 1 refill.    Marolyn Rosser DO

## 2024-06-30 ENCOUNTER — Other Ambulatory Visit: Payer: Self-pay

## 2024-07-04 ENCOUNTER — Other Ambulatory Visit (HOSPITAL_COMMUNITY): Payer: Self-pay

## 2024-07-04 ENCOUNTER — Encounter (HOSPITAL_BASED_OUTPATIENT_CLINIC_OR_DEPARTMENT_OTHER): Payer: Self-pay | Admitting: Family Medicine

## 2024-07-04 ENCOUNTER — Encounter: Payer: Self-pay | Admitting: Pharmacy Technician

## 2024-07-04 ENCOUNTER — Telehealth (HOSPITAL_BASED_OUTPATIENT_CLINIC_OR_DEPARTMENT_OTHER): Payer: Self-pay | Admitting: Pharmacy Technician

## 2024-07-04 ENCOUNTER — Telehealth (HOSPITAL_BASED_OUTPATIENT_CLINIC_OR_DEPARTMENT_OTHER): Payer: Self-pay | Admitting: Pharmacist

## 2024-07-04 NOTE — Telephone Encounter (Signed)
 Devere,   Can you try and file an appeal for this one? She does have OSA and her sleep study is on file.

## 2024-07-04 NOTE — Telephone Encounter (Signed)
 Prior authorization for zepbond previously denied due to pt having medicaid as medicaid no longer covering for injectable medications.  Pt had sleep study performed which showed OSA. Pt is wanting to know if prior authorization might be able to be redone due to this to see if medication could be approved coverage. Sleep study results are able to be seen in pt's chart. Routing encounter to prior auth team.

## 2024-07-04 NOTE — Telephone Encounter (Signed)
 PA request has been Started. New Encounter has been or will be created for follow up. For additional info see Pharmacy Prior Auth telephone encounter from 07/04/2024.

## 2024-07-04 NOTE — Telephone Encounter (Signed)
 error

## 2024-07-04 NOTE — Telephone Encounter (Signed)
 Pharmacy Patient Advocate Encounter  Received notification from [facility] MEDICAID that Prior Authorization for Zepbound  2.5MG /0.5ML pen-injectors  has been closed.   PA #/Case ID/Reference #: [id number]  The insurance will not let us  enter in a new request. It will have to be appealed due to a denied PA already on file from 05/16/2024.  Request will be forwarded to the appeals pharmacist. Thank you.

## 2024-07-04 NOTE — Telephone Encounter (Signed)
 Pharmacy Patient Advocate Encounter   Received notification from Patient Advice Request messages that prior authorization for Zepbound  2.5MG /0.5ML pen-injectors is required/requested.   Insurance verification completed.   The patient is insured through [facility] MEDICAID.   Per test claim: PA required; PA submitted to above mentioned insurance via Latent Key/confirmation #/EOC [facility] Status is pending

## 2024-07-04 NOTE — Telephone Encounter (Signed)
 Appeal has been submitted. Will advise when response is received, please be advised that most companies may take 30 days to make a decision. Appeal letter and supporting clinical documentation have been faxed to [phone] on 07/04/2024 @4 :23 pm.  Thank you, Devere Pandy, PharmD Clinical Pharmacist  [place]  Direct Dial: [phone]

## 2024-07-09 ENCOUNTER — Ambulatory Visit
Admission: RE | Admit: 2024-07-09 | Discharge: 2024-07-09 | Disposition: A | Discharge status: Home / Self Care | Source: Ambulatory Visit | Attending: Family Medicine | Admitting: Family Medicine

## 2024-07-09 DIAGNOSIS — Z1231 Encounter for screening mammogram for malignant neoplasm of breast: Secondary | ICD-10-CM

## 2024-07-15 ENCOUNTER — Ambulatory Visit (INDEPENDENT_AMBULATORY_CARE_PROVIDER_SITE_OTHER): Admitting: Family Medicine

## 2024-07-15 ENCOUNTER — Encounter (HOSPITAL_BASED_OUTPATIENT_CLINIC_OR_DEPARTMENT_OTHER): Payer: Self-pay | Admitting: Family Medicine

## 2024-07-15 ENCOUNTER — Ambulatory Visit (HOSPITAL_COMMUNITY): Admitting: Student in an Organized Health Care Education/Training Program

## 2024-07-15 VITALS — BP 130/85 | HR 86 | Temp 99.3°F | Resp 18 | Ht 65.0 in | Wt 229.0 lb

## 2024-07-15 DIAGNOSIS — Z0000 Encounter for general adult medical examination without abnormal findings: Secondary | ICD-10-CM | POA: Insufficient documentation

## 2024-07-15 DIAGNOSIS — Z23 Encounter for immunization: Secondary | ICD-10-CM

## 2024-07-15 NOTE — Progress Notes (Signed)
 " Subjective:    CC: Annual Physical Exam  HPI: Sydney Mccoy is a 54 y.o. presenting for annual physical  I reviewed the past medical history, family history, social history, surgical history, and allergies today and no changes were needed.  Please see the problem list section below in epic for further details.  Past Medical History: Past Medical History:  Diagnosis Date   Anxiety    Blood transfusion without reported diagnosis    Cataract    Cirrhosis (HCC)    COPD (chronic obstructive pulmonary disease) (HCC)    Depression    Diverticulitis    Tachycardia    Past Surgical History: Past Surgical History:  Procedure Laterality Date   ABDOMINAL HERNIA REPAIR  2009   ANKLE FRACTURE SURGERY  2012   COLON SURGERY     EYE SURGERY  2022   FRACTURE SURGERY     HERNIA REPAIR     INSERTION OF MESH     OOPHORECTOMY Left 2012   PATELLA FRACTURE SURGERY Left 2011   Social History: Social History   Socioeconomic History   Marital status: Divorced    Spouse name: Not on file   Number of children: 1   Years of education: Not on file   Highest education level: Some college, no degree  Occupational History   Occupation: N/A  Tobacco Use   Smoking status: Former    Current packs/day: 0.00    Average packs/day: 0.9 packs/day for 55.0 years (49.5 ttl pk-yrs)    Types: Cigarettes    Start date: 73    Quit date: 2021    Years since quitting: 5.0   Smokeless tobacco: Never  Vaping Use   Vaping status: Never Used  Substance and Sexual Activity   Alcohol use: Not Currently    Alcohol/week: 28.0 standard drinks of alcohol    Types: 28 Standard drinks or equivalent per week    Comment: 4 beers on the weekend - October 30, 2023 quit date   Drug use: Not Currently    Comment: CBD oil   Sexual activity: Yes    Birth control/protection: None  Other Topics Concern   Not on file  Social History Narrative   Not on file   Social Drivers of Health   Tobacco Use: Medium Risk  (07/15/2024)   Patient History    Smoking Tobacco Use: Former    Smokeless Tobacco Use: Never    Passive Exposure: Not on file  Financial Resource Strain: Patient Declined (05/11/2024)   Overall Financial Resource Strain (CARDIA)    Difficulty of Paying Living Expenses: Patient declined  Food Insecurity: No Food Insecurity (05/11/2024)   Epic    Worried About Programme Researcher, Broadcasting/film/video in the Last Year: Never true    Ran Out of Food in the Last Year: Never true  Transportation Needs: No Transportation Needs (05/11/2024)   Epic    Lack of Transportation (Medical): No    Lack of Transportation (Non-Medical): No  Physical Activity: Insufficiently Active (05/11/2024)   Exercise Vital Sign    Days of Exercise per Week: 2 days    Minutes of Exercise per Session: 20 min  Stress: Stress Concern Present (05/11/2024)   [employer] of Occupational Health - Occupational Stress Questionnaire    Feeling of Stress: Rather much  Social Connections: Socially Isolated (05/11/2024)   Social Connection and Isolation Panel    Frequency of Communication with Friends and Family: Three times a week    Frequency of Social Gatherings  with Friends and Family: Patient declined    Attends Religious Services: Never    Database Administrator or Organizations: No    Attends Engineer, Structural: Not on file    Marital Status: Divorced  Depression (PHQ2-9): Low Risk (07/15/2024)   Depression (PHQ2-9)    PHQ-2 Score: 0  Alcohol Screen: Not on file  Housing: Unknown (05/11/2024)   Epic    Unable to Pay for Housing in the Last Year: No    Number of Times Moved in the Last Year: Not on file    Homeless in the Last Year: No  Utilities: Not on file  Health Literacy: Not on file   Family History: Family History  Problem Relation Age of Onset   Heart attack Father    Early death Father    Depression Maternal Grandmother    Miscarriages / Stillbirths Maternal Grandmother    Diabetes Paternal Grandfather     Vision loss Paternal Grandmother    Alcohol abuse Brother    Anxiety disorder Brother    Cancer Paternal Aunt    Colon cancer Neg Hx    Esophageal cancer Neg Hx    Stomach cancer Neg Hx    Rectal cancer Neg Hx    Allergies: Allergies[1] Medications: See med rec.  Review of Systems: No headache, visual changes, nausea, vomiting, diarrhea, constipation, dizziness, abdominal pain, skin rash, fevers, chills, night sweats, swollen lymph nodes, weight loss, chest pain, body aches, joint swelling, muscle aches, shortness of breath, mood changes, visual or auditory hallucinations.  Objective:    BP 130/85 (BP Location: Left Arm, Patient Position: Sitting, Cuff Size: Large)   Pulse 86   Temp 99.3 F (37.4 C) (Oral)   Resp 18   Ht 5' 5 (1.651 m)   Wt 229 lb (103.9 kg)   LMP 03/25/2014   SpO2 95%   BMI 38.11 kg/m   General: Well Developed, well nourished, and in no acute distress.  Neuro: Alert and oriented x3, extra-ocular muscles intact, sensation grossly intact. Cranial nerves II through XII are intact, motor, sensory, and coordinative functions are all intact. HEENT: Normocephalic, atraumatic, pupils equal round reactive to light, neck supple, no masses, no lymphadenopathy, thyroid nonpalpable. Oropharynx, nasopharynx, external ear canals are unremarkable. Skin: Warm and dry, no rashes noted.  Cardiac: Regular rate and rhythm, no murmurs rubs or gallops.  Respiratory: Clear to auscultation bilaterally. Not using accessory muscles, speaking in full sentences.  Abdominal: Soft, nontender, nondistended, positive bowel sounds, no masses, no organomegaly.  Musculoskeletal: Shoulder, elbow, wrist, hip, knee, ankle stable, and with full range of motion.  Impression and Recommendations:    Wellness examination Assessment & Plan: Routine HCM labs ordered. HCM reviewed/discussed. Anticipatory guidance regarding healthy weight, lifestyle and choices given. Recommend healthy diet.  Recommend  approximately 150 minutes/week of moderate intensity exercise Recommend regular dental and vision exams Always use seatbelt/lap and shoulder restraints Recommend using smoke alarms and checking batteries at least twice a year Recommend using sunscreen when outside Discussed colon cancer screening recommendations, options.  Patient UTD Discussed immunization recommendations  Orders: -     TSH Rfx on Abnormal to Free T4 -     Lipid panel -     Hemoglobin A1c -     Comprehensive metabolic panel with GFR -     CBC with Differential/Platelet  Immunization due -     Pneumococcal Conjugate PCV21(Capvaxive)  Occasional palpitations/heart pounding. Has had prior eval with cardiology in the past, no definitive  diagnosis reported by patient. Discussed options including Holter monitor, cardio eval. Will hold off on these for now.  Return in about 4 months (around 11/12/2024).   ___________________________________________ Kaleiyah Polsky de [place], MD, ABFM, CAQSM [facility]    [1] No Known Allergies  "

## 2024-07-15 NOTE — Assessment & Plan Note (Signed)
 Routine HCM labs ordered. HCM reviewed/discussed. Anticipatory guidance regarding healthy weight, lifestyle and choices given. Recommend healthy diet.  Recommend approximately 150 minutes/week of moderate intensity exercise Recommend regular dental and vision exams Always use seatbelt/lap and shoulder restraints Recommend using smoke alarms and checking batteries at least twice a year Recommend using sunscreen when outside Discussed colon cancer screening recommendations, options.  Patient UTD Discussed immunization recommendations

## 2024-07-16 LAB — COMPREHENSIVE METABOLIC PANEL WITH GFR
ALT: 70 IU/L — ABNORMAL HIGH (ref 0–32)
AST: 85 IU/L — ABNORMAL HIGH (ref 0–40)
Albumin: 4.3 g/dL (ref 3.8–4.9)
Alkaline Phosphatase: 97 IU/L (ref 49–135)
BUN/Creatinine Ratio: 13 (ref 9–23)
BUN: 9 mg/dL (ref 6–24)
Bilirubin Total: 0.5 mg/dL (ref 0.0–1.2)
CO2: 22 mmol/L (ref 20–29)
Calcium: 9.2 mg/dL (ref 8.7–10.2)
Chloride: 104 mmol/L (ref 96–106)
Creatinine, Ser: 0.71 mg/dL (ref 0.57–1.00)
Globulin, Total: 3.5 g/dL (ref 1.5–4.5)
Glucose: 88 mg/dL (ref 70–99)
Potassium: 4.4 mmol/L (ref 3.5–5.2)
Sodium: 141 mmol/L (ref 134–144)
Total Protein: 7.8 g/dL (ref 6.0–8.5)
eGFR: 102 mL/min/1.73

## 2024-07-16 LAB — LIPID PANEL
Chol/HDL Ratio: 5.3 ratio — ABNORMAL HIGH (ref 0.0–4.4)
Cholesterol, Total: 229 mg/dL — ABNORMAL HIGH (ref 100–199)
HDL: 43 mg/dL
LDL Chol Calc (NIH): 164 mg/dL — ABNORMAL HIGH (ref 0–99)
Triglycerides: 120 mg/dL (ref 0–149)
VLDL Cholesterol Cal: 22 mg/dL (ref 5–40)

## 2024-07-16 LAB — CBC WITH DIFFERENTIAL/PLATELET
Basophils Absolute: 0 x10E3/uL (ref 0.0–0.2)
Basos: 1 %
EOS (ABSOLUTE): 0.1 x10E3/uL (ref 0.0–0.4)
Eos: 3 %
Hematocrit: 39.4 % (ref 34.0–46.6)
Hemoglobin: 13.2 g/dL (ref 11.1–15.9)
Immature Grans (Abs): 0 x10E3/uL (ref 0.0–0.1)
Immature Granulocytes: 0 %
Lymphocytes Absolute: 1.9 x10E3/uL (ref 0.7–3.1)
Lymphs: 42 %
MCH: 30.6 pg (ref 26.6–33.0)
MCHC: 33.5 g/dL (ref 31.5–35.7)
MCV: 91 fL (ref 79–97)
Monocytes Absolute: 0.3 x10E3/uL (ref 0.1–0.9)
Monocytes: 7 %
Neutrophils Absolute: 2.1 x10E3/uL (ref 1.4–7.0)
Neutrophils: 47 %
Platelets: 126 x10E3/uL — ABNORMAL LOW (ref 150–450)
RBC: 4.31 x10E6/uL (ref 3.77–5.28)
RDW: 13.2 % (ref 11.7–15.4)
WBC: 4.5 x10E3/uL (ref 3.4–10.8)

## 2024-07-16 LAB — TSH RFX ON ABNORMAL TO FREE T4: TSH: 3.72 u[IU]/mL (ref 0.450–4.500)

## 2024-07-16 LAB — HEMOGLOBIN A1C
Est. average glucose Bld gHb Est-mCnc: 143 mg/dL
Hgb A1c MFr Bld: 6.6 % — ABNORMAL HIGH (ref 4.8–5.6)

## 2024-07-21 ENCOUNTER — Other Ambulatory Visit (HOSPITAL_COMMUNITY): Payer: Self-pay

## 2024-07-29 ENCOUNTER — Ambulatory Visit (INDEPENDENT_AMBULATORY_CARE_PROVIDER_SITE_OTHER): Admitting: Student in an Organized Health Care Education/Training Program

## 2024-07-29 ENCOUNTER — Other Ambulatory Visit: Payer: Self-pay

## 2024-07-29 ENCOUNTER — Other Ambulatory Visit (HOSPITAL_COMMUNITY): Payer: Self-pay

## 2024-07-29 DIAGNOSIS — R41840 Attention and concentration deficit: Secondary | ICD-10-CM | POA: Diagnosis not present

## 2024-07-29 DIAGNOSIS — F3341 Major depressive disorder, recurrent, in partial remission: Secondary | ICD-10-CM | POA: Diagnosis not present

## 2024-07-29 DIAGNOSIS — F411 Generalized anxiety disorder: Secondary | ICD-10-CM | POA: Diagnosis not present

## 2024-07-29 MED ORDER — ATOMOXETINE HCL 40 MG PO CAPS
40.0000 mg | ORAL_CAPSULE | Freq: Every day | ORAL | 0 refills | Status: AC
  Filled 2024-07-29: qty 30, 30d supply, fill #0

## 2024-07-29 NOTE — Progress Notes (Signed)
 " BEHAVIORAL [facility] [address] [place] [address] Dept: [phone] Dept Fax: [phone]  Psychotherapy Progress Note  Patient ID: Sydney Mccoy, female  DOB: 10-14-1970, 54 y.o.  MRN: [id number]  07/29/2024 Start time: 1:01 PM End time: 1:50 PM  Method of Visit: In person  Present: patient  Current Concerns:  She reports she has had more medical issues come up since her last appointment.  She reports that she did have a sleep study done and was found to stop breathing like 50 times an hour and so has been started on a CPAP.  He reports that the CPAP has been difficult but she is adjusting to it.  Discussed with her that untreated OSA can worsen depression and anxiety so this should help with her symptoms.  She also reports that her A1c came back at 6.6 and so is diabetic.  She reports her PCP has been working on getting her Zepbound  but she had been denied.  She reports she is fairly certain it will get approved now.  She reports some continued issues with anxiety when Sydney Mccoy was here but is working on reducing this.  She reports continued friction between her and her mother.  She reports that it almost seems like at times Sydney Mccoy has to mediate and she said it sometimes feels like he is more of an adult than they are.  She reports 1 interesting thing that they did yesterday was see the [employer] walk for [place] through [place].  She reports that this was good and more calming then she expected.  She reports that while she has not written in her journal every day she did recently look back and did get a boost from remembering things that she had accomplished.  Provided encouragement and reinforcement of this.  She reports that she does plan to continue journaling.  She reports trouble with cleaning up around the house.  She reports that both her and her mother will say they should get it done but then never do.  Discussed starting 1 item at a  time to get the process started.  She does report continued issues with concentration.  Discussed that there are nonstimulant medications.  Discussed atomoxetine  and she was agreeable to the trial.  Prior to leaving the appointment she confirmed she was in a stable and safe mindset.  She reports no SI, HI, or AVH.        Current Symptoms: Family Stress  Psychiatric Specialty Exam: General Appearance: Casual and Fairly Groomed  Eye Contact:  Good  Speech:  Clear and Coherent and Normal Rate  Volume:  Normal  Mood:  ok  Affect:  Congruent  Thought Process:  Coherent and Goal Directed  Orientation:  Full (Time, Place, and Person)  Thought Content:  WDL and Logical  Suicidal Thoughts:  No  Homicidal Thoughts:  No  Memory:  Immediate;   Good Recent;   Good  Judgement:  Good  Insight:  Good  Psychomotor Activity:  Normal  Concentration:  Concentration: Good and Attention Span: Good  Recall:  Good  Fund of Knowledge:Good  Language: Good  Akathisia:  Negative  Handed:  Right  AIMS (if indicated):  not done  Assets:  Communication Skills Desire for Improvement Housing Resilience Social Support  ADL's:  Intact  Cognition: WNL  Sleep:  Good     Diagnosis: GAD with Panic Disorder, MDD, Recurrent, Severe, w/out Psychosis, EtOH Abuse   Anticipated Frequency of Visits: every  2 weeks Anticipated Length of Treatment Episode: 20  Short Term Goals/Goals for Treatment Session: Continue daily positive affirmation/gratitude, start cleaning up one item at a time Progress Towards Goals: Progressing as she is writing most of the time.   Treatment Intervention: Insight-oriented therapy and Supportive therapy  Medical Necessity: Assisted patient to achieve or maintain maximum functional capacity  Assessment Tools:    07/15/2024    2:52 PM 02/23/2023   10:51 AM 06/16/2022   10:15 AM  Depression screen PHQ 2/9  Decreased Interest 0 2 0  Down, Depressed, Hopeless 0 1 0  PHQ -  2 Score 0 3 0  Altered sleeping 0 0 0  Tired, decreased energy 0 2 2  Change in appetite 0 3 0  Feeling bad or failure about yourself  0 2 2  Trouble concentrating 0 0 3  Moving slowly or fidgety/restless 0 0 0  Suicidal thoughts 0 0 0  PHQ-9 Score 0 10  7   Difficult doing work/chores Not difficult at all  Somewhat difficult     Data saved with a previous flowsheet row definition   Failed to redirect to the Timeline version of the REVFS SmartLink. Flowsheet Row US  [facility] from 11/14/2023 in [facility] ULTRASOUND Most recent reading at 11/14/2023 11:29 AM ED from 01/16/2023 in [facility] Emergency Department at [facility] Most recent reading at 01/16/2023  4:10 PM UC from 01/16/2023 in [facility] at [facility] Most recent reading at 01/16/2023  2:53 PM  C-SSRS RISK CATEGORY No Risk No Risk No Risk    Collaboration of Care:   Patient/Guardian was advised Release of Information must be obtained prior to any record release in order to collaborate their care with an outside provider. Patient/Guardian was advised if they have not already done so to contact the registration department to sign all necessary forms in order for us  to release information regarding their care.   Consent: Patient/Guardian gives verbal consent for treatment and assignment of benefits for services provided during this visit. Patient/Guardian expressed understanding and agreed to proceed.   Plan: Provided talk/supportive therapy.  She has had continued medical issues but is working with her providers to address them.  She will work on beginning to clean up with 1 item removed at time.  She will continue to work on orthoptist.  Due to concerns over inattention we have started atomoxetine .  Prior to leaving the appointment she confirmed she was in a stable and safe mindset.  She reports no SI, HI, or AVH.     GAD  MDD:  -Continue Effexor  XR 112.5 mg daily for depression and  anxiety.  No refills sent at this time. -Continue Remeron  15 mg QHS for depression, andxiety, sleep, and appetite.  No refills sent at this time. -Continue Propanolol 20 mg TID for anxiety and HTN.  No refills sent at this time. -Continue Hydroxyzine  25 mg TID PRN for anxiety.  No refills sent at this time.   -Start Atomoxetine  40 mg daily for inattention.  30 capsules with 0 refills.  Marsa GORMAN Rosser, DO 07/29/2024      "

## 2024-08-05 ENCOUNTER — Other Ambulatory Visit (HOSPITAL_COMMUNITY): Payer: Self-pay

## 2024-08-07 ENCOUNTER — Other Ambulatory Visit: Payer: Self-pay

## 2024-08-08 ENCOUNTER — Other Ambulatory Visit (HOSPITAL_COMMUNITY): Payer: Self-pay

## 2024-08-13 ENCOUNTER — Other Ambulatory Visit (HOSPITAL_COMMUNITY): Payer: Self-pay

## 2024-09-03 ENCOUNTER — Ambulatory Visit (HOSPITAL_BASED_OUTPATIENT_CLINIC_OR_DEPARTMENT_OTHER): Admitting: Pulmonary Disease

## 2024-09-09 ENCOUNTER — Ambulatory Visit (HOSPITAL_COMMUNITY): Admitting: Student in an Organized Health Care Education/Training Program
